# Patient Record
Sex: Female | Born: 1975 | Race: Asian | Hispanic: No | Marital: Married | Smoking: Never smoker
Health system: Southern US, Community
[De-identification: ages and names within clinical notes are randomized; demographics above are authoritative.]

## PROBLEM LIST (undated history)

## (undated) DIAGNOSIS — Z87442 Personal history of urinary calculi: Secondary | ICD-10-CM

## (undated) DIAGNOSIS — Z789 Other specified health status: Secondary | ICD-10-CM

## (undated) HISTORY — PX: NO PAST SURGERIES: SHX2092

## (undated) HISTORY — DX: Personal history of urinary calculi: Z87.442

---

## 1998-03-28 ENCOUNTER — Encounter: Admission: RE | Admit: 1998-03-28 | Discharge: 1998-03-28 | Payer: Self-pay | Admitting: Family Medicine

## 1998-06-20 ENCOUNTER — Encounter: Admission: RE | Admit: 1998-06-20 | Discharge: 1998-06-20 | Payer: Self-pay | Admitting: Family Medicine

## 1998-07-29 ENCOUNTER — Encounter: Admission: RE | Admit: 1998-07-29 | Discharge: 1998-07-29 | Payer: Self-pay | Admitting: Sports Medicine

## 1998-07-29 ENCOUNTER — Other Ambulatory Visit: Admission: RE | Admit: 1998-07-29 | Discharge: 1998-07-29 | Payer: Self-pay | Admitting: *Deleted

## 1998-08-28 ENCOUNTER — Encounter: Admission: RE | Admit: 1998-08-28 | Discharge: 1998-08-28 | Payer: Self-pay | Admitting: Family Medicine

## 1998-09-19 ENCOUNTER — Encounter: Admission: RE | Admit: 1998-09-19 | Discharge: 1998-09-19 | Payer: Self-pay | Admitting: Family Medicine

## 1998-12-19 ENCOUNTER — Encounter: Admission: RE | Admit: 1998-12-19 | Discharge: 1998-12-19 | Payer: Self-pay | Admitting: Family Medicine

## 1999-02-24 ENCOUNTER — Encounter: Admission: RE | Admit: 1999-02-24 | Discharge: 1999-02-24 | Payer: Self-pay | Admitting: Sports Medicine

## 1999-03-20 ENCOUNTER — Encounter: Admission: RE | Admit: 1999-03-20 | Discharge: 1999-03-20 | Payer: Self-pay | Admitting: Sports Medicine

## 1999-07-24 ENCOUNTER — Encounter: Admission: RE | Admit: 1999-07-24 | Discharge: 1999-07-24 | Payer: Self-pay | Admitting: Family Medicine

## 1999-09-18 ENCOUNTER — Encounter: Admission: RE | Admit: 1999-09-18 | Discharge: 1999-09-18 | Payer: Self-pay | Admitting: Family Medicine

## 1999-12-17 ENCOUNTER — Encounter: Admission: RE | Admit: 1999-12-17 | Discharge: 1999-12-17 | Payer: Self-pay | Admitting: Family Medicine

## 2000-01-22 ENCOUNTER — Encounter: Admission: RE | Admit: 2000-01-22 | Discharge: 2000-01-22 | Payer: Self-pay | Admitting: Family Medicine

## 2000-07-28 ENCOUNTER — Encounter: Admission: RE | Admit: 2000-07-28 | Discharge: 2000-07-28 | Payer: Self-pay | Admitting: Family Medicine

## 2001-02-03 ENCOUNTER — Encounter: Admission: RE | Admit: 2001-02-03 | Discharge: 2001-02-03 | Payer: Self-pay | Admitting: Family Medicine

## 2001-02-22 ENCOUNTER — Encounter: Admission: RE | Admit: 2001-02-22 | Discharge: 2001-02-22 | Payer: Self-pay | Admitting: Family Medicine

## 2001-03-27 ENCOUNTER — Encounter: Payer: Self-pay | Admitting: Obstetrics

## 2001-03-27 ENCOUNTER — Inpatient Hospital Stay (HOSPITAL_COMMUNITY): Admission: RE | Admit: 2001-03-27 | Discharge: 2001-03-27 | Payer: Self-pay | Admitting: Obstetrics

## 2001-03-27 ENCOUNTER — Encounter: Admission: RE | Admit: 2001-03-27 | Discharge: 2001-03-27 | Payer: Self-pay | Admitting: Family Medicine

## 2001-03-29 ENCOUNTER — Ambulatory Visit (HOSPITAL_COMMUNITY): Admission: RE | Admit: 2001-03-29 | Discharge: 2001-03-29 | Payer: Self-pay | Admitting: Obstetrics

## 2001-03-29 ENCOUNTER — Encounter (INDEPENDENT_AMBULATORY_CARE_PROVIDER_SITE_OTHER): Payer: Self-pay

## 2001-04-25 ENCOUNTER — Encounter: Admission: RE | Admit: 2001-04-25 | Discharge: 2001-04-25 | Payer: Self-pay | Admitting: Obstetrics & Gynecology

## 2001-10-18 ENCOUNTER — Encounter: Admission: RE | Admit: 2001-10-18 | Discharge: 2001-10-18 | Payer: Self-pay | Admitting: Family Medicine

## 2001-12-08 ENCOUNTER — Encounter: Admission: RE | Admit: 2001-12-08 | Discharge: 2001-12-08 | Payer: Self-pay | Admitting: Family Medicine

## 2002-01-21 ENCOUNTER — Emergency Department (HOSPITAL_COMMUNITY): Admission: EM | Admit: 2002-01-21 | Discharge: 2002-01-21 | Payer: Self-pay | Admitting: Emergency Medicine

## 2002-01-31 ENCOUNTER — Encounter: Admission: RE | Admit: 2002-01-31 | Discharge: 2002-01-31 | Payer: Self-pay | Admitting: Family Medicine

## 2002-02-26 ENCOUNTER — Encounter: Admission: RE | Admit: 2002-02-26 | Discharge: 2002-02-26 | Payer: Self-pay | Admitting: Family Medicine

## 2002-03-02 ENCOUNTER — Ambulatory Visit (HOSPITAL_COMMUNITY): Admission: RE | Admit: 2002-03-02 | Discharge: 2002-03-02 | Payer: Self-pay | Admitting: Family Medicine

## 2002-03-30 ENCOUNTER — Encounter: Admission: RE | Admit: 2002-03-30 | Discharge: 2002-03-30 | Payer: Self-pay | Admitting: Family Medicine

## 2002-05-03 ENCOUNTER — Encounter: Admission: RE | Admit: 2002-05-03 | Discharge: 2002-05-03 | Payer: Self-pay | Admitting: Family Medicine

## 2002-05-31 ENCOUNTER — Encounter: Admission: RE | Admit: 2002-05-31 | Discharge: 2002-05-31 | Payer: Self-pay | Admitting: Family Medicine

## 2002-06-14 ENCOUNTER — Encounter: Admission: RE | Admit: 2002-06-14 | Discharge: 2002-06-14 | Payer: Self-pay | Admitting: Family Medicine

## 2002-06-29 ENCOUNTER — Encounter: Admission: RE | Admit: 2002-06-29 | Discharge: 2002-06-29 | Payer: Self-pay | Admitting: Family Medicine

## 2002-07-10 ENCOUNTER — Encounter: Admission: RE | Admit: 2002-07-10 | Discharge: 2002-07-10 | Payer: Self-pay | Admitting: Sports Medicine

## 2002-07-18 ENCOUNTER — Encounter: Admission: RE | Admit: 2002-07-18 | Discharge: 2002-07-18 | Payer: Self-pay | Admitting: Family Medicine

## 2002-07-27 ENCOUNTER — Encounter: Admission: RE | Admit: 2002-07-27 | Discharge: 2002-07-27 | Payer: Self-pay | Admitting: Family Medicine

## 2002-08-03 ENCOUNTER — Encounter: Admission: RE | Admit: 2002-08-03 | Discharge: 2002-08-03 | Payer: Self-pay | Admitting: Family Medicine

## 2002-08-07 ENCOUNTER — Encounter: Payer: Self-pay | Admitting: *Deleted

## 2002-08-07 ENCOUNTER — Inpatient Hospital Stay (HOSPITAL_COMMUNITY): Admission: RE | Admit: 2002-08-07 | Discharge: 2002-08-07 | Payer: Self-pay | Admitting: *Deleted

## 2002-08-09 ENCOUNTER — Inpatient Hospital Stay (HOSPITAL_COMMUNITY): Admission: AD | Admit: 2002-08-09 | Discharge: 2002-08-11 | Payer: Self-pay | Admitting: Obstetrics and Gynecology

## 2002-08-16 ENCOUNTER — Encounter: Admission: RE | Admit: 2002-08-16 | Discharge: 2002-08-16 | Payer: Self-pay | Admitting: Family Medicine

## 2002-09-19 ENCOUNTER — Encounter: Admission: RE | Admit: 2002-09-19 | Discharge: 2002-09-19 | Payer: Self-pay | Admitting: Family Medicine

## 2002-11-09 ENCOUNTER — Encounter: Admission: RE | Admit: 2002-11-09 | Discharge: 2002-11-09 | Payer: Self-pay | Admitting: Family Medicine

## 2003-02-08 ENCOUNTER — Encounter: Admission: RE | Admit: 2003-02-08 | Discharge: 2003-02-08 | Payer: Self-pay | Admitting: Family Medicine

## 2003-05-10 ENCOUNTER — Encounter: Admission: RE | Admit: 2003-05-10 | Discharge: 2003-05-10 | Payer: Self-pay | Admitting: Family Medicine

## 2003-08-09 ENCOUNTER — Encounter: Admission: RE | Admit: 2003-08-09 | Discharge: 2003-08-09 | Payer: Self-pay | Admitting: Family Medicine

## 2003-09-19 ENCOUNTER — Encounter: Admission: RE | Admit: 2003-09-19 | Discharge: 2003-09-19 | Payer: Self-pay | Admitting: Family Medicine

## 2003-11-08 ENCOUNTER — Encounter: Admission: RE | Admit: 2003-11-08 | Discharge: 2003-11-08 | Payer: Self-pay | Admitting: Family Medicine

## 2004-02-07 ENCOUNTER — Encounter: Admission: RE | Admit: 2004-02-07 | Discharge: 2004-02-07 | Payer: Self-pay | Admitting: Family Medicine

## 2004-05-08 ENCOUNTER — Encounter: Admission: RE | Admit: 2004-05-08 | Discharge: 2004-05-08 | Payer: Self-pay | Admitting: Family Medicine

## 2004-08-07 ENCOUNTER — Ambulatory Visit: Payer: Self-pay | Admitting: Family Medicine

## 2004-12-14 ENCOUNTER — Ambulatory Visit: Payer: Self-pay | Admitting: Family Medicine

## 2005-01-12 ENCOUNTER — Ambulatory Visit: Payer: Self-pay | Admitting: Family Medicine

## 2005-02-09 ENCOUNTER — Ambulatory Visit: Payer: Self-pay | Admitting: Family Medicine

## 2005-02-19 ENCOUNTER — Ambulatory Visit: Payer: Self-pay | Admitting: Family Medicine

## 2005-03-04 ENCOUNTER — Ambulatory Visit: Payer: Self-pay | Admitting: Family Medicine

## 2005-03-12 ENCOUNTER — Ambulatory Visit: Payer: Self-pay | Admitting: Sports Medicine

## 2005-09-10 ENCOUNTER — Ambulatory Visit: Payer: Self-pay | Admitting: Family Medicine

## 2006-01-21 ENCOUNTER — Ambulatory Visit: Payer: Self-pay | Admitting: Sports Medicine

## 2006-01-26 ENCOUNTER — Inpatient Hospital Stay (HOSPITAL_COMMUNITY): Admission: AD | Admit: 2006-01-26 | Discharge: 2006-01-26 | Payer: Self-pay | Admitting: Obstetrics and Gynecology

## 2006-02-03 ENCOUNTER — Encounter (INDEPENDENT_AMBULATORY_CARE_PROVIDER_SITE_OTHER): Payer: Self-pay | Admitting: *Deleted

## 2006-02-11 ENCOUNTER — Ambulatory Visit: Payer: Self-pay | Admitting: Family Medicine

## 2006-03-15 ENCOUNTER — Ambulatory Visit: Payer: Self-pay | Admitting: Family Medicine

## 2006-04-12 ENCOUNTER — Ambulatory Visit (HOSPITAL_COMMUNITY): Admission: RE | Admit: 2006-04-12 | Discharge: 2006-04-12 | Payer: Self-pay | Admitting: Family Medicine

## 2006-04-14 ENCOUNTER — Ambulatory Visit: Payer: Self-pay | Admitting: Family Medicine

## 2006-05-07 ENCOUNTER — Emergency Department (HOSPITAL_COMMUNITY): Admission: EM | Admit: 2006-05-07 | Discharge: 2006-05-07 | Payer: Self-pay | Admitting: Emergency Medicine

## 2006-05-12 ENCOUNTER — Ambulatory Visit: Payer: Self-pay | Admitting: Family Medicine

## 2006-05-27 ENCOUNTER — Ambulatory Visit: Payer: Self-pay | Admitting: Family Medicine

## 2006-06-03 ENCOUNTER — Ambulatory Visit: Payer: Self-pay | Admitting: Family Medicine

## 2006-06-06 ENCOUNTER — Ambulatory Visit: Payer: Self-pay | Admitting: Sports Medicine

## 2006-07-11 ENCOUNTER — Ambulatory Visit: Payer: Self-pay | Admitting: Family Medicine

## 2006-07-13 ENCOUNTER — Inpatient Hospital Stay (HOSPITAL_COMMUNITY): Admission: AD | Admit: 2006-07-13 | Discharge: 2006-07-13 | Payer: Self-pay | Admitting: Gynecology

## 2006-07-13 ENCOUNTER — Ambulatory Visit: Payer: Self-pay | Admitting: *Deleted

## 2006-07-18 ENCOUNTER — Ambulatory Visit (HOSPITAL_COMMUNITY): Admission: RE | Admit: 2006-07-18 | Discharge: 2006-07-18 | Payer: Self-pay | Admitting: Family Medicine

## 2006-07-18 ENCOUNTER — Ambulatory Visit: Payer: Self-pay | Admitting: Family Medicine

## 2006-07-26 ENCOUNTER — Ambulatory Visit: Payer: Self-pay | Admitting: Sports Medicine

## 2006-08-09 ENCOUNTER — Ambulatory Visit: Payer: Self-pay | Admitting: Sports Medicine

## 2006-08-18 ENCOUNTER — Ambulatory Visit: Payer: Self-pay | Admitting: Family Medicine

## 2006-08-25 ENCOUNTER — Ambulatory Visit: Payer: Self-pay | Admitting: Family Medicine

## 2006-08-29 ENCOUNTER — Ambulatory Visit: Payer: Self-pay | Admitting: Gynecology

## 2006-08-29 ENCOUNTER — Inpatient Hospital Stay (HOSPITAL_COMMUNITY): Admission: AD | Admit: 2006-08-29 | Discharge: 2006-09-01 | Payer: Self-pay | Admitting: Gynecology

## 2006-08-30 ENCOUNTER — Ambulatory Visit: Payer: Self-pay | Admitting: Sports Medicine

## 2006-09-02 ENCOUNTER — Ambulatory Visit: Payer: Self-pay | Admitting: Family Medicine

## 2006-09-05 ENCOUNTER — Ambulatory Visit: Payer: Self-pay | Admitting: Sports Medicine

## 2006-10-24 ENCOUNTER — Ambulatory Visit: Payer: Self-pay | Admitting: Family Medicine

## 2007-02-02 DIAGNOSIS — E781 Pure hyperglyceridemia: Secondary | ICD-10-CM | POA: Insufficient documentation

## 2007-02-03 ENCOUNTER — Encounter (INDEPENDENT_AMBULATORY_CARE_PROVIDER_SITE_OTHER): Payer: Self-pay | Admitting: *Deleted

## 2007-03-02 ENCOUNTER — Emergency Department (HOSPITAL_COMMUNITY): Admission: EM | Admit: 2007-03-02 | Discharge: 2007-03-02 | Payer: Self-pay | Admitting: Emergency Medicine

## 2007-05-09 ENCOUNTER — Telehealth: Payer: Self-pay | Admitting: *Deleted

## 2007-05-09 ENCOUNTER — Encounter: Payer: Self-pay | Admitting: *Deleted

## 2007-05-09 ENCOUNTER — Encounter: Payer: Self-pay | Admitting: Family Medicine

## 2007-05-09 ENCOUNTER — Ambulatory Visit: Payer: Self-pay | Admitting: Sports Medicine

## 2007-05-09 LAB — CONVERTED CEMR LAB
Bilirubin Urine: NEGATIVE
Protein, U semiquant: 30
Urobilinogen, UA: 0.2

## 2007-05-11 ENCOUNTER — Encounter: Admission: RE | Admit: 2007-05-11 | Discharge: 2007-05-11 | Payer: Self-pay | Admitting: Sports Medicine

## 2007-05-11 ENCOUNTER — Encounter (INDEPENDENT_AMBULATORY_CARE_PROVIDER_SITE_OTHER): Payer: Self-pay | Admitting: *Deleted

## 2007-05-11 ENCOUNTER — Telehealth: Payer: Self-pay | Admitting: *Deleted

## 2007-05-11 ENCOUNTER — Ambulatory Visit: Payer: Self-pay | Admitting: Family Medicine

## 2007-05-11 LAB — CONVERTED CEMR LAB: hCG, Beta Chain, Quant, S: <2 milliintl units/mL

## 2007-05-17 ENCOUNTER — Telehealth: Payer: Self-pay | Admitting: *Deleted

## 2007-10-11 ENCOUNTER — Ambulatory Visit: Payer: Self-pay | Admitting: Family Medicine

## 2007-11-24 ENCOUNTER — Encounter (INDEPENDENT_AMBULATORY_CARE_PROVIDER_SITE_OTHER): Payer: Self-pay | Admitting: Family Medicine

## 2007-11-24 ENCOUNTER — Ambulatory Visit: Payer: Self-pay | Admitting: Family Medicine

## 2007-11-24 DIAGNOSIS — Z87442 Personal history of urinary calculi: Secondary | ICD-10-CM | POA: Insufficient documentation

## 2007-11-24 DIAGNOSIS — O99810 Abnormal glucose complicating pregnancy: Secondary | ICD-10-CM | POA: Insufficient documentation

## 2007-11-24 HISTORY — DX: Personal history of urinary calculi: Z87.442

## 2007-12-01 ENCOUNTER — Encounter (INDEPENDENT_AMBULATORY_CARE_PROVIDER_SITE_OTHER): Payer: Self-pay | Admitting: Family Medicine

## 2008-09-06 ENCOUNTER — Ambulatory Visit: Payer: Self-pay | Admitting: Family Medicine

## 2010-02-02 ENCOUNTER — Ambulatory Visit: Payer: Self-pay | Admitting: Family Medicine

## 2010-02-02 ENCOUNTER — Encounter: Payer: Self-pay | Admitting: Family Medicine

## 2010-02-02 LAB — CONVERTED CEMR LAB: Pap Smear: NEGATIVE

## 2010-02-05 LAB — CONVERTED CEMR LAB
Cholesterol: 213 mg/dL — ABNORMAL HIGH (ref 0–200)
LDL Cholesterol: 139 mg/dL — ABNORMAL HIGH (ref 0–99)
VLDL: 34 mg/dL (ref 0–40)

## 2010-04-07 ENCOUNTER — Ambulatory Visit: Payer: Self-pay | Admitting: Family Medicine

## 2010-04-07 DIAGNOSIS — L255 Unspecified contact dermatitis due to plants, except food: Secondary | ICD-10-CM | POA: Insufficient documentation

## 2010-12-08 ENCOUNTER — Ambulatory Visit: Admit: 2010-12-08 | Payer: Self-pay

## 2011-01-05 NOTE — Assessment & Plan Note (Signed)
Summary: poison ivy   Vital Signs:  Patient profile:   35 year old female Height:      62 inches Weight:      154.2 pounds BMI:     28.31 Temp:     98.5 degrees F oral Pulse rate:   70 / minute BP sitting:   111 / 80  (left arm) Cuff size:   regular  Vitals Entered By: Gladstone Pih (Apr 07, 2010 11:01 AM) CC: poison ivy Is Patient Diabetic? No Pain Assessment Patient in pain? no        Primary Care Provider:  Eustaquio Boyden  MD  CC:  poison ivy.  History of Present Illness: 35yo F w/ acute poison ivy  Poison Ivy: x 6 days.  States she was exposed to poison ivy last week while working in the yard.  She has a rash on her hands, arms, and face sparing the eyes, mouth, or mucosa.  No difficulty breathing.  Habits & Providers  Alcohol-Tobacco-Diet     Tobacco Status: never  Current Medications (verified): 1)  Prednisone 10 Mg Tabs (Prednisone) .... 4 Tabs By Mouth Daily X 3 Days, Then 3 Tabs X 3 Days, Then 2 Tabs X 3 Days, Then 1 Tab X 3 Days 2)  Hydroxyzine Hcl 25 Mg Tabs (Hydroxyzine Hcl) .Marland Kitchen.. 1 Tab By Mouth Q6h As Needed For Itching  Allergies (verified): No Known Drug Allergies  Review of Systems      See HPI  Physical Exam  General:  VS Reviewed. Well appearing, NAD.  Lungs:  no respiratory distress Skin:  erythematous papular vesicular lesions that are linear on her hands, arms, and face   Impression & Recommendations:  Problem # 1:  POISON IVY DERMATITIS (ICD-692.6) Assessment New  Hx and exam c/w acute poison ivy dermatitis sparing the mucosa plan to treat with longer prednisone taper with hydroxyzine for itching will f/u next week if no improvement  Her updated medication list for this problem includes:    Prednisone 10 Mg Tabs (Prednisone) .Marland KitchenMarland KitchenMarland KitchenMarland Kitchen 4 tabs by mouth daily x 3 days, then 3 tabs x 3 days, then 2 tabs x 3 days, then 1 tab x 3 days  Orders: [facility]- Est Level  3 (25956)  Complete Medication List: 1)  Prednisone 10 Mg Tabs (Prednisone)  .... 4 tabs by mouth daily x 3 days, then 3 tabs x 3 days, then 2 tabs x 3 days, then 1 tab x 3 days 2)  Hydroxyzine Hcl 25 Mg Tabs (Hydroxyzine hcl) .Marland Kitchen.. 1 tab by mouth q6h as needed for itching  Patient Instructions: 1)  Follow up next week if symptoms not improving. 2)  Take the prednisone and hydroxyzine as prescribed. Prescriptions: HYDROXYZINE HCL 25 MG TABS (HYDROXYZINE HCL) 1 tab by mouth q6h as needed for itching  #20 x 0   Entered and Authorized by:   Marisue Ivan  MD   Signed by:   Marisue Ivan  MD on 04/07/2010   Method used:   Electronically to        [facility] (retail)       [address]       [place], [place]  [id number]       Ph: [phone]       Fax: [phone]   RxID:   [id number] PREDNISONE 10 MG TABS (PREDNISONE) 4 tabs by mouth daily x 3 days, then 3 tabs x 3 days, then 2 tabs x 3 days, then 1  tab x 3 days  #30 x 0   Entered and Authorized by:   Marisue Ivan  MD   Signed by:   Marisue Ivan  MD on 04/07/2010   Method used:   Electronically to        [facility] (retail)       [address]       [place], [place]  [id number]       Ph: [phone]       Fax: [phone]   RxID:   [id number]

## 2011-01-05 NOTE — Assessment & Plan Note (Signed)
Summary: CPE- pap/A1C/lipids   History of Present Illness:  Pt states that she has no complaints today and simply would like to have a complete physical ex    Habits & Providers  Alcohol-Tobacco-Diet     Alcohol drinks/day: 0     Tobacco Status: never     Diet Comments: 4-5 servings of vegtables per day in rice  Exercise-Depression-Behavior     Does Patient Exercise: yes     Type of exercise: walking      Exercise (avg: min/session): 10-20     Times/week: 4     Have you felt down or hopeless? no     Have you felt little pleasure in things? no     Drug Use: never     Seat Belt Use: always  Current Medications (verified): 1)  None  Allergies (verified): No Known Drug Allergies  Social History: Moved to [place] at age 59.  2 daughters (56, 80 year old), 31 son (93 year old).  Works as a Firefighter for [employer]; -no smoking hx; no EtOH; lives with husband Engineer, petroleum) and children      Does Patient Exercise:  yes Drug Use:  never Risk analyst Use:  always  Review of Systems  The patient denies anorexia, fever, weight gain, vision loss, decreased hearing, chest pain, syncope, and peripheral edema.    Physical Exam  General:  Well-developed,well-nourished,in no acute distress; alert,appropriate and cooperative throughout examination Ears:  External ear exam shows no significant lesions or deformities.  Otoscopic examination reveals clear canals, tympanic membranes are intact bilaterally without bulging, retraction, inflammation or discharge. Hearing is grossly normal bilaterally. Breasts:  No mass, nodules, thickening, tenderness, bulging, retraction, inflamation, nipple discharge or skin changes noted.   Lungs:  Normal respiratory effort, chest expands symmetrically. Lungs are clear to auscultation, no crackles or wheezes. Heart:  Normal rate and regular rhythm. S1 and S2 normal without gallop, murmur, click, rub or other extra sounds. Genitalia:  Pelvic Exam:        External:  normal female genitalia without lesions or masses        Vagina: normal without lesions or masses        Cervix: normal without lesions or masses        Adnexa: normal bimanual exam without masses or fullness        Uterus: normal by palpation        Pap smear: performed- some bleeding with attainment of samples.  Unable to visualize IUD string-  vaginal walls partially occluding view.   Psych:  Oriented X3.  PHQ-2 negative   Impression & Recommendations:  Problem # 1:  SCREENING FOR MALIGNANT NEOPLASM OF THE CERVIX (ICD-V76.2) Assessment Unchanged Pap smear sent to lab.  Pt reports that she has no history of abnormal pap.  Will contact pt with any abnormal results.  Orders: Pap Smear-[facility] ([id number]) [facility] - Est  18-39 yrs ([id number])  Problem # 2:  IUD (ICD-V25.1) Assessment: Unchanged Unable to visualize IUD string.  Pt states that this has happened in the past and that they sent her for ultrasound and that the IUD was still in place.  Pt states that she last felt the string about 2 weeks ago.  She also states that she would like to try to feel it at home before going again to ultrasound.  Encouraged pt to please call if she is not able to feel the strings so that we can send her for repeat ultrasound to confirm  placement.  Otherwise we can not ensure that the IUD is in the correct position to prevent pregancy.  Pt states understanding. Orders: [facility] - Est  18-39 yrs ([id number])  Problem # 3:  Hx of GESTATIONAL DIABETES (ICD-648.80) A1C 5.6--will continue to screen yearly since pt high risk for type 2 diabetes 2/2 to hx of gestational diabetes. Orders: A1C-[facility] ([id number]) [facility] - Est  18-39 yrs ([id number])  Problem # 4:  HYPERTRIGLYCERIDEMIA (ICD-272.1) lipid panel drawn today to ensure that lipids WNL.  Pt has risk factor of father with MI in 69's.  Will contact pt with any abnormal result.  Orders: Lipid-[facility] ([id number]) [facility] - Est  18-39 yrs [phone])  Patient Instructions: 1)  return for 1  year check up in one year 2)  I will call you if any abnormality in lab work. 3)  If you are unable to feel the IUD string, please give me a call and we will schedule and ultrasound to confirm placement of IUD.   Prevention & Chronic Care Immunizations   Influenza vaccine: Fluvax Non-MCR  (09/10/2008)    Tetanus booster: 10/06/2001: Done.    Pneumococcal vaccine: Not documented  Other Screening   Pap smear: Done.  (02/03/2006)   Smoking status: never  (02/02/2010)  Lipids   Total Cholesterol: Not documented   LDL: Not documented   LDL Direct: Not documented   HDL: Not documented   Triglycerides: Not documented    SGOT (AST): Not documented   SGPT (ALT): Not documented   Alkaline phosphatase: Not documented   Total bilirubin: Not documented    Lipid flowsheet reviewed?: Yes   Progress toward LDL goal: Unchanged  Self-Management Support :    Lipid self-management support: Not documented   Laboratory Results   Blood Tests   Date/Time Received: February 02, 2010 4:53 PM  Date/Time Reported: February 02, 2010 5:04 PM   HGBA1C: 5.6%   (Normal Range: Non-Diabetic - 3-6%   Control Diabetic - 6-8%)  Comments: ...........test performed by...........Marland KitchenTerese Door, CMA

## 2011-01-27 ENCOUNTER — Encounter: Payer: Self-pay | Admitting: *Deleted

## 2011-03-26 ENCOUNTER — Encounter: Payer: Self-pay | Admitting: Family Medicine

## 2011-03-26 ENCOUNTER — Ambulatory Visit (INDEPENDENT_AMBULATORY_CARE_PROVIDER_SITE_OTHER): Payer: BLUE CROSS/BLUE SHIELD | Admitting: Family Medicine

## 2011-03-26 VITALS — BP 112/77 | Temp 98.3°F | Wt 155.0 lb

## 2011-03-26 DIAGNOSIS — L255 Unspecified contact dermatitis due to plants, except food: Secondary | ICD-10-CM

## 2011-03-26 MED ORDER — CLOBETASOL PROPIONATE 0.05 % EX GEL: CUTANEOUS | Status: DC

## 2011-03-26 MED ORDER — PREDNISONE 10 MG PO TABS: ORAL_TABLET | ORAL | Status: DC

## 2011-03-26 NOTE — Assessment & Plan Note (Signed)
Rash consistent with contact dermatitis, likely from poison ivy.  Will give steroid taper and rx temovate cream for short term.  Told to return if worsening symptoms or redness/swelling develops.

## 2011-03-26 NOTE — Progress Notes (Signed)
  Subjective:    Patient ID: Laura Velez, female    DOB: 05/02/1976, 35 y.o.   MRN: [id number]  HPI Out picking leaves and exposed to poison ivy two weeks ago.  Last had last year and used a prescription cream and oral steroid.  Rash mainly on arm, neck and chin and very itchy.  Using ivarest at home, helps some.  Some clear drainage from area on arm.     Review of Systems Denies fever, chills, bleeding of areas.    Objective:   Physical Exam  Skin: Rash noted.       vesiculo-Papular rash on arms and neck with streaking.  Non pustular, no drainage, bleeding, warmth, surrounding erythema.          Assessment & Plan:

## 2011-04-23 NOTE — Op Note (Signed)
[facility]  Patient:    Laura Velez, Laura Velez                            MRN: [id number] Proc. Date: 03/29/01 Adm. Date:  [id number] Attending:  Tammi Sou CC:         [facility]   Operative Report  PREOPERATIVE DIAGNOSIS:       A 10 week anembryonic gestation on ultrasound.  POSTOPERATIVE DIAGNOSIS:      A 10 week anembryonic gestation on ultrasound.  PROCEDURE:                    Suction dilation and evacuation.  SURGEON:                      Charles A. Clearance Coots, M.D.  ANESTHESIA:                   MAC with paracervical block.  ESTIMATED BLOOD LOSS:         100 ml.  COMPLICATIONS:                None.  SPECIMENS:                    Products of conception.  DESCRIPTION OF PROCEDURE:     The patient was brought to the operating room and, after satisfactory IV sedation, the legs were brought up in stirrups and the vagina was prepped and draped in the usual sterile fashion.  The urinary bladder was emptied of approximately 50 cc of clear urine.  Bimanual examination revealed the uterus to about 10-12 size and mid position.  A sterile speculum was inserted in the vaginal vault and the cervix was isolated.  The anterior lip of the cervix was grasped with a single-tooth tenaculum.  A paracervical block of 2% Xylocaine was injected in the lateral fornices at the 3 and 9 oclock positions in routine fashion, approximately 8 ml in each lateral fornix.  The uterus was sounded and the cervix was dilated to a #25 Pratt dilator.  A #8 suction catheter was then easily introduced into the uterine cavity and all contents were evacuated.  There was no active bleeding at the conclusion of the procedure.  The uterus contracted down and was firm after the procedure.  All instruments were retired.  The patient tolerated the procedure well and was transported to the recovery room in satisfactory condition. DD:  03/29/01 TD:   03/30/01 Job: [id number]

## 2011-07-09 ENCOUNTER — Encounter: Payer: Self-pay | Admitting: Family Medicine

## 2011-07-09 ENCOUNTER — Ambulatory Visit (INDEPENDENT_AMBULATORY_CARE_PROVIDER_SITE_OTHER): Payer: BLUE CROSS/BLUE SHIELD | Admitting: Family Medicine

## 2011-07-09 VITALS — BP 123/85 | HR 87 | Temp 98.3°F | Wt 150.3 lb

## 2011-07-09 DIAGNOSIS — J029 Acute pharyngitis, unspecified: Secondary | ICD-10-CM | POA: Insufficient documentation

## 2011-07-09 LAB — POCT RAPID STREP A (OFFICE): Rapid Strep A Screen: NEGATIVE

## 2011-07-09 MED ORDER — PENICILLIN V POTASSIUM 500 MG PO TABS: 500.0000 mg | ORAL_TABLET | Freq: Three times a day (TID) | ORAL | Status: AC

## 2011-07-09 NOTE — Progress Notes (Signed)
  Subjective:    Patient ID: Laura Velez, female    DOB: 07/13/1976, 35 y.o.   MRN: [id number]  HPI Patient here with complaint of generalized body ache/malaise, and sore throat, which both began on Weds, Aug 1st.  Son (age 37) was diagnosed with strep throat in his pediatrician's office on that date, started on abx for this.  She noticed onset of sore throat and malaise on that date, has been same ever since.  Has had no fevers or chills, no cough, no ear pain, no rhinorrhea, no nausea or vomiting.  She has had some "hot urine" when she voids, but not increased frequency or urgency, no change in appearance of urine.   Has had UTIs in the past, this does not feel like one.    Review of Systems  See HPI. Son is only sick contact.       Objective:   Physical Exam Well appearing, no apparent distress.  HEENT Neck supple.  Shotty anterior cervical adenopathy. Enlarged erythematous tonsils and small amount pharyngeal exudates; tonsils are symmetrical and not touching ("kissing").  TMs clear.  Conjunctivae clear bilaterally. No nasal discharge or purulence, no maxillary tenderness.  COR RRR, no extra sounds PULM Clear bilaterally, no rales or wheezes.  ABD Soft, nontender, no organomegaly, no suprapubic or CVA tenderness.        Assessment & Plan:

## 2011-07-09 NOTE — Patient Instructions (Signed)
It was a pleasure to see you today.  Your findings on today's exam and testing are somewhat conflicted regarding the possibility of strep throat.  Your throat exam and absence of cough support it; the negative strep test and absence of fevers go against it.   I am treating you with Penicillin V 500mg  tablets, take 1 tablet by mouth every 8 hours (three times daily) for 10 days.   You may use a throat spray (Chlora-Septic) to soothe your throat (available over the counter). Gargles with salt water.  Ibuprofen over the counter (200mg  tablets), you may take 3 to 4 tablets by mouth every 6 to 8 hours as needed for body aches. Best to take with something to eat.  If you continue to feel achy and ill through the weekend, I want you to call us on Monday morning to see you back.   Please take note if you have more burning/discomfort with urinating, or if you are going to the bathroom more frequently.

## 2011-07-09 NOTE — Assessment & Plan Note (Signed)
Patient with acute pharyngitis, generalized ache, 35 yr old son with diagnosed strep throat, and she has absence of cough.  Centor criteria that are lacking include fevers, marked adenopathy.  Her rapid strep is negative.  Other possible sources for generalized malaise and sore throat are few; she initially reported some mild dysuria ("feels hot") without change in urine appearance or habits, does not feel like prior cystitis that she had years ago.  Is unable to give urine sample today (just went to [place]).  Will opt to treat empirically for GAS pharyngitis, with instructions to call back if not better by Aug 6.  If new sxs develop, to take note of them.  She agrees with plan.

## 2011-10-15 ENCOUNTER — Ambulatory Visit (INDEPENDENT_AMBULATORY_CARE_PROVIDER_SITE_OTHER): Payer: BLUE CROSS/BLUE SHIELD | Admitting: *Deleted

## 2011-10-15 DIAGNOSIS — Z23 Encounter for immunization: Secondary | ICD-10-CM

## 2011-11-19 ENCOUNTER — Ambulatory Visit (INDEPENDENT_AMBULATORY_CARE_PROVIDER_SITE_OTHER): Payer: BLUE CROSS/BLUE SHIELD | Admitting: Family Medicine

## 2011-11-19 ENCOUNTER — Encounter: Payer: Self-pay | Admitting: Family Medicine

## 2011-11-19 VITALS — BP 112/79 | HR 61 | Temp 98.1°F | Ht 62.0 in | Wt 152.0 lb

## 2011-11-19 DIAGNOSIS — T8389XA Other specified complication of genitourinary prosthetic devices, implants and grafts, initial encounter: Secondary | ICD-10-CM

## 2011-11-19 DIAGNOSIS — T8332XA Displacement of intrauterine contraceptive device, initial encounter: Secondary | ICD-10-CM

## 2011-11-19 NOTE — Progress Notes (Signed)
  Subjective:    Patient ID: Laura Velez, female    DOB: 1976-03-20, 35 y.o.   MRN: [id number]  HPI Patient here for IUD removal and reinsertion: Patient states that she would like IUD removed. In past patient has had difficulty feeling strings and providers have had difficulty identifying strings. Has had ultrasound to confirm placement in the past. Hasn't like the IUD-would like reinsertion. Has had this IUD x5 years. Have spotting times a few days each month during menstrual period.  Review of Systems As per above.no abdominal pain. No fever. No weight changes. No vaginal bleeding currently. No vaginal discharge.    Objective:   Physical Exam  Constitutional: She is oriented to person, place, and time. She appears well-developed and well-nourished.  Cardiovascular: Normal rate.   Pulmonary/Chest: Effort normal. No respiratory distress.  Abdominal: Soft. She exhibits no distension. There is no tenderness.  Genitourinary: Vagina normal and uterus normal. Rectal exam shows external hemorrhoid (at 12 clock position). There is no rash, tenderness, lesion or injury on the right labia. There is no rash, tenderness, lesion or injury on the left labia. Cervix exhibits no motion tenderness and no friability. Discharge: moderate amount yellow/clear discharge.    Musculoskeletal: She exhibits no edema.  Neurological: She is alert and oriented to person, place, and time.  Psychiatric: She has a normal mood and affect. Her behavior is normal.          Assessment & Plan:

## 2011-11-19 NOTE — Assessment & Plan Note (Signed)
Unable to visualize IUD strings. Will obtain ultrasound to confirm placement. Will refer to OB/GYN for removal of IUD and reinsertion.

## 2011-12-22 ENCOUNTER — Ambulatory Visit: Payer: BLUE CROSS/BLUE SHIELD | Admitting: Family Medicine

## 2011-12-31 ENCOUNTER — Ambulatory Visit (INDEPENDENT_AMBULATORY_CARE_PROVIDER_SITE_OTHER): Payer: BLUE CROSS/BLUE SHIELD | Admitting: Family Medicine

## 2011-12-31 VITALS — BP 110/72 | HR 72 | Temp 98.0°F | Ht 62.5 in | Wt 152.3 lb

## 2011-12-31 DIAGNOSIS — J069 Acute upper respiratory infection, unspecified: Secondary | ICD-10-CM

## 2011-12-31 DIAGNOSIS — M25519 Pain in unspecified shoulder: Secondary | ICD-10-CM

## 2011-12-31 DIAGNOSIS — T148XXA Other injury of unspecified body region, initial encounter: Secondary | ICD-10-CM

## 2011-12-31 MED ORDER — KETOROLAC TROMETHAMINE 30 MG/ML IJ SOLN
30.0000 mg | Freq: Once | INTRAMUSCULAR | Status: AC
  Administered 2011-12-31: 30 mg via INTRAMUSCULAR

## 2011-12-31 MED ORDER — BENZONATATE 100 MG PO CAPS: 100.0000 mg | ORAL_CAPSULE | Freq: Three times a day (TID) | ORAL | Status: DC | PRN

## 2011-12-31 NOTE — Assessment & Plan Note (Signed)
Toradol 30 IM x 1 in clinic. Dicussed prn NSAID use. Will continue to follow prn.

## 2011-12-31 NOTE — Patient Instructions (Signed)
Viral Infections A virus is a type of germ. Viruses can cause:  Minor sore throats.   Aches and pains.   Headaches.   Runny nose.   Rashes.   Watery eyes.   Tiredness.   Coughs.   Loss of appetite.   Feeling sick to your stomach (nausea).   Throwing up (vomiting).   Watery poop (diarrhea).  HOME CARE   Only take medicines as told by your doctor.   Drink enough water and fluids to keep your pee (urine) clear or pale yellow. Sports drinks are a good choice.   Get plenty of rest and eat healthy. Soups and broths with crackers or rice are fine.  GET HELP RIGHT AWAY IF:   You have a very bad headache.   You have shortness of breath.   You have chest pain or neck pain.   You have an unusual rash.   You cannot stop throwing up.   You have watery poop that does not stop.   You cannot keep fluids down.   You or your child has a temperature by mouth above 102 F (38.9 C), not controlled by medicine.   Your baby is older than 3 months with a rectal temperature of 102 F (38.9 C) or higher.   Your baby is 3 months old or younger with a rectal temperature of 100.4 F (38 C) or higher.  MAKE SURE YOU:   Understand these instructions.   Will watch this condition.   Will get help right away if you are not doing well or get worse.  Document Released: 11/04/2008 Document Revised: 08/04/2011 Document Reviewed: 03/30/2011 Exit[employer] Patient Information 2012 [employer]. 

## 2011-12-31 NOTE — Assessment & Plan Note (Signed)
Discussed supportive care as well as infectious red flags. Tessalon Perles for cough. Handout given.

## 2011-12-31 NOTE — Progress Notes (Signed)
  Subjective:    Patient ID: Laura Velez, female    DOB: 1976/06/15, 36 y.o.   MRN: [id number]  HPI Pt presents today with chief complaint of URI. Symptoms include rhinorrhea, cough, generalized malaise, headache. No fevers or chills. Pt states that she has had multiple sick contacts with similar sxs. No nausea, vomiting, diarrhea. Pt states that she has had some pleuritic chest pain associated with coughing. Pt also reports that she had a mild L shoulder strain associated with a coughing episode yesterday. Pt is a non smoker. Pt states that she has had a flu shot this year.    Review of Systems See HPI, otherwise ROS negative. .     Objective:   Physical Exam Gen: up in chair, NAD HEENT: NCAT, EOMI, TMs clear bilaterally; +nasal erythema, rhinorrhea bilaterally, + post oropharyngeal erythema  CV: RRR, no murmurs auscultated PULM: CTAB, no wheezes, rales, rhoncii ABD: S/NT/+ bowel sounds  EXT: 2+ peripheral pulses   MSK: Mild TTP along medial aspect of L scapula Assessment & Plan:

## 2012-02-09 ENCOUNTER — Other Ambulatory Visit: Payer: Self-pay | Admitting: Obstetrics and Gynecology

## 2012-02-09 ENCOUNTER — Other Ambulatory Visit (HOSPITAL_COMMUNITY)
Admission: RE | Admit: 2012-02-09 | Discharge: 2012-02-09 | Disposition: A | Discharge status: Home / Self Care | Payer: BC Managed Care – PPO | Source: Ambulatory Visit | Attending: Obstetrics and Gynecology | Admitting: Obstetrics and Gynecology

## 2012-02-09 DIAGNOSIS — Z01419 Encounter for gynecological examination (general) (routine) without abnormal findings: Secondary | ICD-10-CM | POA: Insufficient documentation

## 2012-02-14 ENCOUNTER — Other Ambulatory Visit: Payer: Self-pay | Admitting: Obstetrics and Gynecology

## 2012-02-14 DIAGNOSIS — N63 Unspecified lump in unspecified breast: Secondary | ICD-10-CM

## 2012-02-21 ENCOUNTER — Ambulatory Visit
Admission: RE | Admit: 2012-02-21 | Discharge: 2012-02-21 | Disposition: A | Discharge status: Home / Self Care | Payer: BLUE CROSS/BLUE SHIELD | Source: Ambulatory Visit | Attending: Obstetrics and Gynecology | Admitting: Obstetrics and Gynecology

## 2012-02-21 DIAGNOSIS — N63 Unspecified lump in unspecified breast: Secondary | ICD-10-CM

## 2012-04-18 ENCOUNTER — Encounter: Payer: Self-pay | Admitting: Family Medicine

## 2012-04-18 ENCOUNTER — Ambulatory Visit (INDEPENDENT_AMBULATORY_CARE_PROVIDER_SITE_OTHER): Payer: BC Managed Care – PPO | Admitting: Family Medicine

## 2012-04-18 VITALS — BP 110/78 | HR 76 | Temp 98.4°F | Ht 62.5 in | Wt 156.3 lb

## 2012-04-18 DIAGNOSIS — J029 Acute pharyngitis, unspecified: Secondary | ICD-10-CM

## 2012-04-18 LAB — POCT RAPID STREP A (OFFICE): Rapid Strep A Screen: NEGATIVE

## 2012-04-18 NOTE — Patient Instructions (Signed)

## 2012-04-20 ENCOUNTER — Encounter: Payer: Self-pay | Admitting: Family Medicine

## 2012-04-20 NOTE — Progress Notes (Signed)
  Subjective:     Laura Velez is a 36 y.o. female who presents for evaluation of sore throat. Associated symptoms include myalgias, post nasal drip, sinus and nasal congestion and sore throat. There is no cough or fever, nausea or vomiting, diarrhea. Onset of symptoms was 2 days ago, and have been unchanged since that time. She is drinking plenty of fluids. She has not had a recent close exposure to someone with proven streptococcal pharyngitis, though her son has a history of tonsil problems lately.  The following portions of the patient's history were reviewed and updated as appropriate: allergies, current medications, past family history, past medical history, past social history and problem list.  Review of Systems Pertinent items are noted in HPI.    Objective:    BP 110/78  Pulse 76  Temp(Src) 98.4 F (36.9 C) (Oral)  Ht 5' 2.5" (1.588 m)  Wt 156 lb 4.8 oz (70.897 kg)  BMI 28.13 kg/m2 General:  alert, cooperative, appears stated age and no distress  Mouth:  lips, mucosa, and tongue normal; teeth and gums normal and pharyngeal erythema, small edema noted. no exudates or lesions noted.   Neck: no adenopathy and supple, symmetrical, trachea midline.  : Chest - clear to auscultation, no wheezes, rales or rhonchi, symmetric air entry Heart - normal rate, regular rhythm, normal S1, S2, no murmurs, rubs, clicks or gallops Neurological - alert, oriented, normal speech, no focal findings or movement disorder noted   Laboratory Strep test done. Results:negative    Assessment:     Acute Pharyngitis, likely  Viral pharyngitis    Plan:    Use of OTC analgesics recommended as well as salt water gargles. Use of decongestant recommended. Follow up as needed.

## 2012-06-12 ENCOUNTER — Ambulatory Visit (INDEPENDENT_AMBULATORY_CARE_PROVIDER_SITE_OTHER): Payer: BC Managed Care – PPO | Admitting: Physician Assistant

## 2012-06-12 VITALS — BP 114/78 | HR 61 | Temp 98.5°F | Resp 16 | Ht 62.0 in | Wt 156.2 lb

## 2012-06-12 DIAGNOSIS — H109 Unspecified conjunctivitis: Secondary | ICD-10-CM

## 2012-06-12 DIAGNOSIS — A499 Bacterial infection, unspecified: Secondary | ICD-10-CM

## 2012-06-12 DIAGNOSIS — B9689 Other specified bacterial agents as the cause of diseases classified elsewhere: Secondary | ICD-10-CM

## 2012-06-12 DIAGNOSIS — H1089 Other conjunctivitis: Secondary | ICD-10-CM

## 2012-06-12 MED ORDER — CIPROFLOXACIN HCL 0.3 % OP SOLN: 1.0000 [drp] | OPHTHALMIC | Status: DC

## 2012-06-12 NOTE — Progress Notes (Signed)
  Subjective:    Patient ID: Laura Velez, female    DOB: 1976/09/09, 36 y.o.   MRN: [id number]  HPI Patient presents with 3 day history of left eye erythema, purulent drainage, and irritation.  She was saw her 3 year old nephew over the weekend and he was diagnosed with pink eye. Both her brother and brother-in-law now have pink eye too.  She complains of foreign body sensation and some itching.  Denies sore throat, nasal congestion, otalgia, cough, vision changes, fever or chills. She does not wear contacts or glasses.     Review of Systems  All other systems reviewed and are negative.       Objective:   Physical Exam  Constitutional: She is oriented to person, place, and time. She appears well-developed and well-nourished.  HENT:  Head: Normocephalic and atraumatic.  Right Ear: External ear normal.  Left Ear: External ear normal.  Mouth/Throat: No oropharyngeal exudate.  Eyes: EOM are normal. Pupils are equal, round, and reactive to light. Right eye exhibits no discharge. Left eye exhibits discharge (purulent). Right conjunctiva is not injected. Left conjunctiva is injected.  Neck: Normal range of motion.  Cardiovascular: Normal rate, regular rhythm and normal heart sounds.   Pulmonary/Chest: Effort normal and breath sounds normal.  Neurological: She is alert and oriented to person, place, and time.  Psychiatric: She has a normal mood and affect. Her behavior is normal. Judgment and thought content normal.          Assessment & Plan:   1. Bacterial conjunctivitis  Use Cipro ophthamic solution as directed x 7 days Recommend warm wash cloth several times a day to help with swelling and drainage Follow up if symptoms persist.

## 2012-06-12 NOTE — Patient Instructions (Addendum)
Bacterial Conjunctivitis Conjunctivitis is an irritation (inflammation) of the clear membrane that covers the white part of the eye (conjunctiva). The irritation can also happen on the underside of the eyelids. Conjunctivitis makes the eye red or pink in color. This is what is commonly known as pink eye. CAUSES   Infection from a germ (bacteria) on the surface of the eye.   Infection from the irritation or injury of nearby tissues such as the eyelids or cornea.   More serious inflammation or infection on the inside of the eye.   Other eye diseases.   The use of certain eye medications.  SYMPTOMS  The normally white color of the eye or the underside of the eyelid is usually pink or red in color. The pink eye is usually associated with irritation, tearing and some sensitivity to light. Bacterial conjunctivitis is often associated with a thick, yellowish discharge from the eye. If a discharge is present, there may also be some blurred vision in the affected eye. DIAGNOSIS  Conjunctivitis is diagnosed by an eye exam. The eye specialist looks for changes in the surface tissues of the eye which take on changes that point to the specific type of conjunctivitis. A sample of any discharge may be collected on a Q-Tip (sterile swap). The sample will be sent to a lab to see whether or not the inflammation is caused by bacterial or viral infection. TREATMENT  Bacterial conjunctivitis is treated with medicines that kill germs (antibiotics). Drops are most often used. However, antibiotic ointments are available and may be preferred by some patients. Antibiotics by mouth (oral) are sometimes used. Artificial tears or eye washes may ease discomfort. HOME CARE INSTRUCTIONS   To ease discomfort, apply a cool, clean wash cloth to the eye for 10 to 20 minutes, 3 to 4 times a day.   Gently wipe away any drainage from the eye with a warm, wet washcloth or a cotton ball.   Wash your hands often with soap. Use paper  towels to dry.   Do not share towels or wash cloths. This may spread the infection.   Change or wash your pillow case every day.   You should not use eye make-up until the infection is gone.   Do not operate machinery or drive if vision is blurred.   Stop using contacts lenses. Ask your eye professional how to sterilize or replace them before using again. This depends on the type of contact lenses used.   Do not touch the edge of the eyelid with the eye drop bottle or ointment tube when applying medications to the affected eye. This will stop you from spreading the infection to the other eye or to others. Do as your caregiver tell you.  SEEK IMMEDIATE MEDICAL CARE IF:   The infection has not improved within 3 days of beginning treatment.   A yellow discharge from the eye develops.   Pain in the eye increases.   The redness is spreading.   Vision becomes blurred.   An oral temperature above 102 F (38.9 C) develops, or as your caregiver suggests.   Facial pain, redness or swelling develops.   Any problems that may be related to the prescribed medicine develops.  MAKE SURE YOU:   Understand these instructions.   Will watch your condition.   Will get help right away if you are not doing well or get worse.  Document Released: 11/22/2005 Document Revised: 11/11/2011 Document Reviewed: 07/11/2008 ExitCare Patient Information 2012 [employer]. 

## 2012-06-15 ENCOUNTER — Telehealth: Payer: Self-pay

## 2012-06-15 MED ORDER — CIPROFLOXACIN HCL 0.3 % OP SOLN: OPHTHALMIC | Status: DC

## 2012-06-15 MED ORDER — CIPROFLOXACIN HCL 0.3 % OP SOLN: 1.0000 [drp] | OPHTHALMIC | Status: DC

## 2012-06-15 NOTE — Telephone Encounter (Signed)
Spoke with patient and she lost her eye drops and still has four days left on them.  Can we give another rx for ciprofloxacin?  Call [phone] before 5 and [phone] after 5. thanks

## 2012-06-15 NOTE — Telephone Encounter (Signed)
PT STATES SHE LOST HER EYE DROPS,NEEDS NEW RX   BEST PHONE [phone]  [facility]

## 2012-06-15 NOTE — Telephone Encounter (Signed)
New Rx sent in

## 2012-06-15 NOTE — Telephone Encounter (Signed)
Pt notified that that her rx was sent in

## 2012-09-12 ENCOUNTER — Ambulatory Visit: Payer: BC Managed Care – PPO

## 2012-10-09 ENCOUNTER — Ambulatory Visit: Payer: BC Managed Care – PPO

## 2012-10-12 ENCOUNTER — Ambulatory Visit (INDEPENDENT_AMBULATORY_CARE_PROVIDER_SITE_OTHER): Payer: PRIVATE HEALTH INSURANCE | Admitting: *Deleted

## 2012-10-12 DIAGNOSIS — Z23 Encounter for immunization: Secondary | ICD-10-CM

## 2013-08-30 ENCOUNTER — Encounter: Payer: Self-pay | Admitting: Family Medicine

## 2013-08-30 ENCOUNTER — Ambulatory Visit (INDEPENDENT_AMBULATORY_CARE_PROVIDER_SITE_OTHER): Payer: PRIVATE HEALTH INSURANCE | Admitting: Family Medicine

## 2013-08-30 VITALS — BP 100/70 | HR 82 | Temp 98.3°F | Ht 62.0 in | Wt 154.0 lb

## 2013-08-30 DIAGNOSIS — Z331 Pregnant state, incidental: Secondary | ICD-10-CM

## 2013-08-30 DIAGNOSIS — N912 Amenorrhea, unspecified: Secondary | ICD-10-CM

## 2013-08-30 DIAGNOSIS — Z3490 Encounter for supervision of normal pregnancy, unspecified, unspecified trimester: Secondary | ICD-10-CM

## 2013-08-30 DIAGNOSIS — R110 Nausea: Secondary | ICD-10-CM

## 2013-08-30 MED ORDER — PRENATAL MULTIVITAMIN CH: 1.0000 | ORAL_TABLET | Freq: Every day | ORAL | Status: DC

## 2013-08-30 MED ORDER — VITAMIN B-6 25 MG PO TABS: 25.0000 mg | ORAL_TABLET | Freq: Every day | ORAL | Status: DC

## 2013-08-30 NOTE — Progress Notes (Signed)
Patient newly dx'd OB and wants to see Drs. Piloto or [place] for OB care.  Informed patient that she will need to see different provider here Richarda Blade, [place], [place], or [place]).  Patient currently has appt with [facility] for 09/21/13.  Patient states she will keep appt with [facility] and call back here if she changes her mind.  Gaylene Brooks, RN

## 2013-08-30 NOTE — Progress Notes (Signed)
Family Medicine Office Visit Note   Subjective:   Patient ID: Laura Velez, female  DOB: Oct 15, 1976, 37 y.o.. MRN: [id number]   Pt that comes today for same-day appointment reporting amenorrhea with LMP of August 1st. She has done a urine pregnancy test at home and was positive. She comes here for confirmation.  She reports mild nausea but no other current complaints. Denies vaginal bleeding or discharge.   Review of Systems:  Per HPI   Objective:   Physical Exam: Gen:  NAD HEENT: Moist mucous membranes  CV: Regular rate and rhythm, no murmurs rubs or gallops PULM: Clear to auscultation bilaterally. No wheezes/rales/rhonchi ABD: Soft, non tender, non distended, normal bowel sounds EXT: No edema Declines pelvic exam. We can doppler FH with mild difficulty due to GA, but they are definitively present.  Assessment & Plan:

## 2013-08-30 NOTE — Patient Instructions (Addendum)
Congratulations with your pregnancy!! You can take vitamin B 6 as prescribed and also start taking prenatal tablets. Please make an appointment for your first prenatal care.

## 2013-08-30 NOTE — Assessment & Plan Note (Signed)
Positive pregnancy test and with LMP 7.6 wks today. Fetal heart tones heard but very distant and hard to obtain.  P/ Instructed to make appointment for first OB visit and appropriate labs.  Pt with mild morning sickness symptoms: Vit B6 recommended Prenatal tab also prescribed

## 2013-09-17 ENCOUNTER — Encounter: Payer: PRIVATE HEALTH INSURANCE | Admitting: Family Medicine

## 2013-09-27 ENCOUNTER — Inpatient Hospital Stay (HOSPITAL_COMMUNITY): Payer: PRIVATE HEALTH INSURANCE

## 2013-09-27 ENCOUNTER — Inpatient Hospital Stay (HOSPITAL_COMMUNITY)
Admission: AD | Admit: 2013-09-27 | Discharge: 2013-09-27 | Disposition: A | Discharge status: Home / Self Care | Payer: PRIVATE HEALTH INSURANCE | Source: Ambulatory Visit | Attending: Obstetrics & Gynecology | Admitting: Obstetrics & Gynecology

## 2013-09-27 ENCOUNTER — Encounter (HOSPITAL_COMMUNITY): Payer: Self-pay

## 2013-09-27 DIAGNOSIS — R109 Unspecified abdominal pain: Secondary | ICD-10-CM | POA: Insufficient documentation

## 2013-09-27 DIAGNOSIS — O034 Incomplete spontaneous abortion without complication: Secondary | ICD-10-CM

## 2013-09-27 HISTORY — DX: Other specified health status: Z78.9

## 2013-09-27 LAB — HCG, QUANTITATIVE, PREGNANCY: hCG, Beta Chain, Quant, S: 1754 m[IU]/mL — ABNORMAL HIGH (ref <5)

## 2013-09-27 LAB — URINALYSIS, ROUTINE W REFLEX MICROSCOPIC
Bilirubin Urine: NEGATIVE
Glucose, UA: NEGATIVE mg/dL
Ketones, ur: NEGATIVE mg/dL
Protein, ur: NEGATIVE mg/dL
pH: 6.5 (ref 5.0–8.0)

## 2013-09-27 LAB — CBC
Hemoglobin: 13 g/dL (ref 12.0–15.0)
MCH: 28.2 pg (ref 26.0–34.0)
MCV: 83.7 fL (ref 78.0–100.0)
Platelets: 262 10*3/uL (ref 150–400)
RDW: 12.9 % (ref 11.5–15.5)
WBC: 12.7 10*3/uL — ABNORMAL HIGH (ref 4.0–10.5)

## 2013-09-27 LAB — URINE MICROSCOPIC-ADD ON

## 2013-09-27 LAB — WET PREP, GENITAL
Trich, Wet Prep: NONE SEEN
WBC, Wet Prep HPF POC: NONE SEEN
Yeast Wet Prep HPF POC: NONE SEEN

## 2013-09-27 MED ORDER — OXYCODONE-ACETAMINOPHEN 5-325 MG PO TABS
2.0000 | ORAL_TABLET | Freq: Once | ORAL | Status: AC
  Administered 2013-09-27: 2 via ORAL
  Filled 2013-09-27: qty 2

## 2013-09-27 MED ORDER — HYDROMORPHONE HCL PF 2 MG/ML IJ SOLN
2.0000 mg | Freq: Once | INTRAMUSCULAR | Status: AC
  Administered 2013-09-27: 2 mg via INTRAMUSCULAR
  Filled 2013-09-27: qty 1

## 2013-09-27 MED ORDER — HYDROMORPHONE HCL PF 2 MG/ML IJ SOLN: 2.0000 mg | Freq: Once | INTRAMUSCULAR | Status: DC

## 2013-09-27 MED ORDER — ONDANSETRON 8 MG PO TBDP: 8.0000 mg | ORAL_TABLET | Freq: Once | ORAL | Status: DC | PRN

## 2013-09-27 MED ORDER — OXYCODONE-ACETAMINOPHEN 5-325 MG PO TABS: 1.0000 | ORAL_TABLET | ORAL | Status: DC | PRN

## 2013-09-27 NOTE — MAU Provider Note (Signed)
Chief Complaint: No chief complaint on file.   First Provider Initiated Contact with Patient 09/27/13 0400     SUBJECTIVE HPI: Laura Velez is a 37 y.o. G1P0 at [redacted]w[redacted]d by LMP who presents with cramping tonight and spotting since 09/23/2013 that has progressed to bleeding like a period. Seen at [facility] 08/30/2013 for pregnancy confirmation. FHTs dopplered per note, but pt states HR was 100 and she would have 7.6 by LMP. Was seen at [facility] a few days ago. RN visit and blood work only. Thinks she will go back to [facility] where she got [facility] w/ previous babies.   Concerned because she has not experienced any pregnancy changes like she did w/ previous pregnancies, but feels like her abd is "too big" for this point in the pregnancy.   Past Medical History  Diagnosis Date  . RENAL CALCULUS, HX OF 11/24/2007   OB History  Gravida Para Term Preterm AB SAB TAB Ectopic Multiple Living  1             # Outcome Date GA Lbr Len/2nd Weight Sex Delivery Anes PTL Lv  1 CUR              No past surgical history on file. History   Social History  . Marital Status: Married    Spouse Name: N/A    Number of Children: N/A  . Years of Education: N/A   Occupational History  . Not on file.   Social History Main Topics  . Smoking status: Never Smoker   . Smokeless tobacco: Not on file  . Alcohol Use: Not on file  . Drug Use: Not on file  . Sexual Activity: Not on file   Other Topics Concern  . Not on file   Social History Narrative  . No narrative on file   No current facility-administered medications on file prior to encounter.   Current Outpatient Prescriptions on File Prior to Encounter  Medication Sig Dispense Refill  . Prenatal Vit-Fe Fumarate-FA (PRENATAL MULTIVITAMIN) TABS tablet Take 1 tablet by mouth daily at 12 noon.  30 tablet  11  . vitamin B-6 (PYRIDOXINE) 25 MG tablet Take 1 tablet (25 mg total) by mouth daily.  30 tablet  2   No Known Allergies  ROS: Pertinent items in HPI. Neg for  fever, chills, N/V/D/C, urinary complaints.   OBJECTIVE Height 5\' 2"  (1.575 m), weight 72.576 kg (160 lb), last menstrual period 07/06/2013. GENERAL: Well-developed, well-nourished female in mild distress.  HEENT: Normocephalic HEART: normal rate RESP: normal effort ABDOMEN: Soft, mild low abd tenderness. Fundus non-palpable. Pos BS.  EXTREMITIES: Nontender, no edema NEURO: Alert and oriented SPECULUM EXAM: NEFG, 1 cm NT cyst on left vaginal wall.moderate amount of bloody mucus. Small amount of active bleeding. Cervix incompletely visualized, but non-friable. Appears slightly open, but no GS seen.  BIMANUAL: cervix 1.5 cm. 1-2 cm ?cyst palpated; uterus slightly enlarged, no adnexal tenderness or masses.  LAB RESULTS Results for orders placed during the hospital encounter of 09/27/13 (from the past 168 hour(s))  CBC   Collection Time    09/27/13  4:05 AM      Result Value Range   WBC 12.7 (*) 4.0 - 10.5 K/uL   RBC 4.61  3.87 - 5.11 MIL/uL   Hemoglobin 13.0  12.0 - 15.0 g/dL   HCT 40.9  81.1 - 91.4 %   MCV 83.7  78.0 - 100.0 fL   MCH 28.2  26.0 - 34.0 pg   MCHC  33.7  30.0 - 36.0 g/dL   RDW 16.1  09.6 - 04.5 %   Platelets 262  150 - 400 K/uL  HCG, QUANTITATIVE, PREGNANCY   Collection Time    09/27/13  4:05 AM      Result Value Range   hCG, Beta Chain, Quant, S 1754 (*) <5 mIU/mL  ABO/RH   Collection Time    09/27/13  4:05 AM      Result Value Range   ABO/RH(D) A POS    URINALYSIS, ROUTINE W REFLEX MICROSCOPIC   Collection Time    09/27/13  4:47 AM      Result Value Range   Color, Urine YELLOW  YELLOW   APPearance CLEAR  CLEAR   Specific Gravity, Urine <1.005 (*) 1.005 - 1.030   pH 6.5  5.0 - 8.0   Glucose, UA NEGATIVE  NEGATIVE mg/dL   Hgb urine dipstick LARGE (*) NEGATIVE   Bilirubin Urine NEGATIVE  NEGATIVE   Ketones, ur NEGATIVE  NEGATIVE mg/dL   Protein, ur NEGATIVE  NEGATIVE mg/dL   Urobilinogen, UA 0.2  0.0 - 1.0 mg/dL   Nitrite NEGATIVE  NEGATIVE    Leukocytes, UA NEGATIVE  NEGATIVE  URINE MICROSCOPIC-ADD ON   Collection Time    09/27/13  4:47 AM      Result Value Range   Squamous Epithelial / LPF RARE  RARE   RBC / HPF 21-50  <3 RBC/hpf  WET PREP, GENITAL   Collection Time    09/27/13  5:07 AM      Result Value Range   Yeast Wet Prep HPF POC NONE SEEN  NONE SEEN   Trich, Wet Prep NONE SEEN  NONE SEEN   Clue Cells Wet Prep HPF POC NONE SEEN  NONE SEEN   WBC, Wet Prep HPF POC NONE SEEN  NONE SEEN  GC/CHLAMYDIA PROBE AMP   Collection Time    09/27/13  5:07 AM      Result Value Range   CT Probe RNA NEGATIVE  NEGATIVE   GC Probe RNA NEGATIVE  NEGATIVE     IMAGING US Ob Comp Less 14 Wks  09/27/2013   CLINICAL DATA:  Spotting. Estimated gestational age by LMP is 11 weeks 6 days.  EXAM: OBSTETRIC <14 WK ULTRASOUND  TECHNIQUE: Transabdominal ultrasound was performed for evaluation of the gestation as well as the maternal uterus and adnexal regions.  COMPARISON:  None.  FINDINGS: Intrauterine gestational sac: There is a single intrauterine gestational sac which is located low in the endometrial cavity just above the lower uterine segment.  Yolk sac:  Yolk sac is not identified.  Embryo:  Fetal pole is identified. No fetal motion is observed.  Cardiac Activity: No fetal cardiac activity is observed.  Heart Rate: 0 bpm  CRL:   12.2  mm   7 w 3 d                  [place] EDC: 05/12/2012  Maternal uterus/adnexae: No subchorionic hemorrhage. No focal myometrial mass lesions. Right ovary is visualized and appears normal. Left ovary is not identified. No free pelvic fluid collections.  IMPRESSION: Single intrauterine pregnancy. Fetal movement and cardiac activity are not observed. Based on crown-rump length of greater than 7 mm and no heartbeat identified, changes are consistent with intrauterine fetal demise. Findings meet definitive criteria for failed pregnancy. This recommendation follows SRU consensus guidelines: Diagnostic Criteria for Nonviable  Pregnancy Early in the First Trimester. [facility] Mis J Med [phone].  Electronically Signed   By: Burman Nieves M.D.   On: 09/27/2013 05:01   US Ob Transvaginal  09/27/2013   CLINICAL DATA:  Spotting. Estimated gestational age by LMP is 11 weeks 6 days.  EXAM: OBSTETRIC <14 WK ULTRASOUND  TECHNIQUE: Transabdominal ultrasound was performed for evaluation of the gestation as well as the maternal uterus and adnexal regions.  COMPARISON:  None.  FINDINGS: Intrauterine gestational sac: There is a single intrauterine gestational sac which is located low in the endometrial cavity just above the lower uterine segment.  Yolk sac:  Yolk sac is not identified.  Embryo:  Fetal pole is identified. No fetal motion is observed.  Cardiac Activity: No fetal cardiac activity is observed.  Heart Rate: 0 bpm  CRL:   12.2  mm   7 w 3 d                  [place] EDC: 05/12/2012  Maternal uterus/adnexae: No subchorionic hemorrhage. No focal myometrial mass lesions. Right ovary is visualized and appears normal. Left ovary is not identified. No free pelvic fluid collections.  IMPRESSION: Single intrauterine pregnancy. Fetal movement and cardiac activity are not observed. Based on crown-rump length of greater than 7 mm and no heartbeat identified, changes are consistent with intrauterine fetal demise. Findings meet definitive criteria for failed pregnancy. This recommendation follows SRU consensus guidelines: Diagnostic Criteria for Nonviable Pregnancy Early in the First Trimester. [facility] Mis J Med [phone].   Electronically Signed   By: Burman Nieves M.D.   On: 09/27/2013 05:01   MAU COURSE Quant, ABO/Rh, CBC, [place], Wet Prep, GC/CT.   Percocet given.  1191: Still having significant cramping. Cannot tolerate. Dilaudid IM ordered. Small amount of bleeding. GS still not visible at os.   Pain improved significantly. Bleeding small amount. Ready for D/C.  ASSESSMENT 1. Incomplete miscarriage    PLAN Discharge home in  stable condition. Discussed options for management of incomplete AB including expectant management, Cytotec or D&C. Prefers expectant management at this time. Verbalizes understanding that intervention may become necessary if SAB in not completed spontaneously or if heavy bleeding or infection occur.   Bleeding/pain/fever precautions. Support given.  Bleeding/infection precautions.  Unsure about where she ultimately plans to continue Ob/gyn care, but will keep appt at [facility] for now.  Follow-up Information   Follow up with [facility] On 09/28/2013. (as scheduled)    Contact information:   [address] [place] [address] [phone]      Follow up with [facility] [place] [facility]ERN[facility] ADMISSIONS. (As needed in emergencies)    Contact information:   [address] [id number] [place] [address] [phone]       Medication List         oxyCODONE-acetaminophen 5-325 MG per tablet  Commonly known as:  PERCOCET/ROXICET  Take 1-2 tablets by mouth every 4 (four) hours as needed for pain.     prenatal multivitamin Tabs tablet  Take 1 tablet by mouth daily at 12 noon.     vitamin B-6 25 MG tablet  Commonly known as:  pyridOXINE  Take 1 tablet (25 mg total) by mouth daily.       [place], CNM 09/27/2013  3:59 AM

## 2013-09-27 NOTE — MAU Note (Signed)
Lower abdominal pain feels like "having baby" since last night. Some spotting on toilet paper earlier this week but not bleeding heavier.

## 2013-09-28 ENCOUNTER — Other Ambulatory Visit: Payer: Self-pay | Admitting: Obstetrics & Gynecology

## 2013-10-15 ENCOUNTER — Ambulatory Visit (INDEPENDENT_AMBULATORY_CARE_PROVIDER_SITE_OTHER): Payer: PRIVATE HEALTH INSURANCE | Admitting: *Deleted

## 2013-10-15 DIAGNOSIS — Z23 Encounter for immunization: Secondary | ICD-10-CM

## 2014-03-12 ENCOUNTER — Encounter: Payer: Self-pay | Admitting: Family Medicine

## 2014-03-12 ENCOUNTER — Ambulatory Visit (INDEPENDENT_AMBULATORY_CARE_PROVIDER_SITE_OTHER): Payer: BC Managed Care – PPO | Admitting: Family Medicine

## 2014-03-12 VITALS — BP 110/78 | HR 67 | Temp 98.2°F | Ht 63.0 in | Wt 165.1 lb

## 2014-03-12 DIAGNOSIS — M545 Low back pain, unspecified: Secondary | ICD-10-CM

## 2014-03-12 DIAGNOSIS — E785 Hyperlipidemia, unspecified: Secondary | ICD-10-CM

## 2014-03-12 MED ORDER — IBUPROFEN 600 MG PO TABS: 600.0000 mg | ORAL_TABLET | Freq: Three times a day (TID) | ORAL | Status: DC | PRN

## 2014-03-12 NOTE — Patient Instructions (Signed)
Please follow up in 3 months.  Start exercising 30 minutes 3-4 times per week.

## 2014-03-14 DIAGNOSIS — M545 Low back pain, unspecified: Secondary | ICD-10-CM | POA: Insufficient documentation

## 2014-03-14 DIAGNOSIS — E785 Hyperlipidemia, unspecified: Secondary | ICD-10-CM | POA: Insufficient documentation

## 2014-03-14 NOTE — Progress Notes (Signed)
Patient ID: Laura Velez    DOB: Jul 14, 1976, 38 y.o.   MRN: [id number] --- Subjective:  Laura Velez is a 38 y.o.female who presents to discuss cholesterol tests she had done at her gynecologist's office as well as discuss low back pain.  - low back pain: located across lower back, intermittent since September/oct. Occurs when she sits for a long period of time. Resolves when she stands up and walks. No lower extr weakness, no urine or bowel incontinence, no pain down legs. She uses heating pads, massage and ibuprofen which help.  - cholesterol: done last month: total 214, trig: 123, LDL: 151, HDL: 38 She doesn't exercise regularly. Diet does include vegetables. No sodas.   ROS: see HPI Past Medical History: reviewed and updated medications and allergies. Social History: Tobacco: none  Objective: Filed Vitals:   03/12/14 1547  BP: 110/78  Pulse: 67  Temp: 98.2 F (36.8 C)    Physical Examination:   General appearance - alert, well appearing, and in no distress Chest - clear to auscultation, no wheezes, rales or rhonchi, symmetric air entry Heart - normal rate, regular rhythm, normal S1, S2, no murmurs Back - no tenderness to palpation along spine or paraspinal muscles, normal extension, flexion, lateral bend of the spine, normal strength in lower extremities bilaterally, patellar reflex trace and symmetric.

## 2014-03-14 NOTE — Assessment & Plan Note (Signed)
total 214, trig: 123, LDL: 151, HDL: 38 Doesn't meet criteria for statin but patient does have a significant cardiac history with her father.  Patient to work on diet and exercise and repeat lipid in 3 months.

## 2014-03-14 NOTE — Assessment & Plan Note (Signed)
No red flags on exam or history.  - low back exercises  - increase physical activity - ibuprofen for pain - continue massage and heat

## 2014-08-08 ENCOUNTER — Ambulatory Visit: Payer: BC Managed Care – PPO | Admitting: Family Medicine

## 2014-08-28 ENCOUNTER — Ambulatory Visit (INDEPENDENT_AMBULATORY_CARE_PROVIDER_SITE_OTHER): Payer: BC Managed Care – PPO

## 2014-08-28 ENCOUNTER — Ambulatory Visit (INDEPENDENT_AMBULATORY_CARE_PROVIDER_SITE_OTHER): Payer: BC Managed Care – PPO | Admitting: Family Medicine

## 2014-08-28 VITALS — BP 109/72 | HR 69 | Temp 98.1°F | Resp 16 | Ht 62.5 in | Wt 161.4 lb

## 2014-08-28 DIAGNOSIS — M545 Low back pain, unspecified: Secondary | ICD-10-CM

## 2014-08-28 MED ORDER — MELOXICAM 7.5 MG PO TABS: 7.5000 mg | ORAL_TABLET | Freq: Every day | ORAL | Status: DC | PRN

## 2014-08-28 NOTE — Patient Instructions (Signed)
Try the mobic each morning (do not combine with other over the counter pain relievers).   Heat or ice to area as needed and the other treatments and exercises in the back care manual as tolerated. If not improving in 2-3 weeks - return for recheck. Return to the clinic or go to the nearest emergency room if any of your symptoms worsen or new symptoms occur.

## 2014-08-28 NOTE — Progress Notes (Signed)
Subjective:  This chart was scribed for Laura Staggers, MD by Laura Velez, ED Scribe. This Patient was seen in room 01 and the patients care was started at 8:18 PM   Patient ID: Laura Velez, female    DOB: 1976/03/24, 38 y.o.   MRN: [id number]  Chief Complaint  Patient presents with  . Back Pain    Lower Back pain x 6 months -[facility]-    Back Pain Pertinent negatives include no dysuria, fever, numbness or weakness.   HPI Comments: Laura Velez is a 38 y.o. female who presents to the [facility] complaining of back pain onset 6 months prior.   Here for lowe back pain. Last seen in April by Dr. Gwenlyn Velez. Advised physical activity, back exercises, ibuprofen, and massage or heat. At that time pain was noted after sitting for long period of time. No x-rays performed then. She states that the pain doesn't radiate. She denies injury or trauma to the back. She states that her pain has not improved since April. She states she takes OTC pain medication every night before bed that provides temporary relief. She states that the pain still worsens with sitting. She states she sits all day at work and has tried several things to improve her posture while sitting at work. She states that walking also provides some relief. Denies fever, unexpected weight change, no weakness, bladder.bowel incontinence, numbness, dysuria or hematuria.   Patient Active Problem List   Diagnosis Date Noted  . Low back pain 03/14/2014  . Other and unspecified hyperlipidemia 03/14/2014  . Pregnancy 08/30/2013  . Malpositioned IUD 11/19/2011  . Pharyngitis, acute 07/09/2011  . 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 RENAL CALCULUS, HX OF 11/24/2007  . Medical history non-contributory    Past Surgical History  Procedure Laterality Date  . No past surgeries     No Known Allergies Prior to Admission medications   Medication Sig Start Date End Date Taking? Authorizing  Provider  ibuprofen (ADVIL,MOTRIN) 600 MG tablet Take 1 tablet (600 mg total) by mouth every 8 (eight) hours as needed. 03/12/14  Yes Laura Skinner, MD   History   Social History  . Marital Status: Married    Spouse Name: N/A    Number of Children: N/A  . Years of Education: N/A   Occupational History  . Not on file.   Social History Main Topics  . Smoking status: Never Smoker   . Smokeless tobacco: Not on file  . Alcohol Use: No  . Drug Use: No  . Sexual Activity: Yes   Other Topics Concern  . Not on file   Social History Narrative  . No narrative on file    Review of Systems  Constitutional: Negative for fever and unexpected weight change.  Genitourinary: Negative.  Negative for dysuria and hematuria.  Musculoskeletal: Positive for back pain.  Neurological: Negative for weakness and numbness.   Objective:   BP 109/72  Pulse 69  Temp(Src) 98.1 F (36.7 C) (Oral)  Resp 16  Ht 5' 2.5" (1.588 m)  Wt 161 lb 6.4 oz (73.211 kg)  BMI 29.03 kg/m2  SpO2 100%   Physical Exam  Nursing note and vitals reviewed. Constitutional: She is oriented to person, place, and time. She appears well-developed and well-nourished. No distress.  HENT:  Head: Normocephalic and atraumatic.  Eyes: Conjunctivae and EOM are normal.  Neck: Neck supple.  Cardiovascular: Normal rate.   Pulmonary/Chest: Effort  normal. No respiratory distress.  Musculoskeletal: Normal range of motion.  lower lumbar area diffusely is the area of pain she describes butno focal tenderness, non tender on exam, no appreciable spasm   Neurological: She is alert and oriented to person, place, and time. She displays no Babinski's sign on the right side. She displays no Babinski's sign on the left side.  Reflex Scores:      Patellar reflexes are 2+ on the right side and 2+ on the left side.      Achilles reflexes are 2+ on the right side and 2+ on the left side. Negative SLR, able to heel and toe walk without  diffuctly  Skin: Skin is warm and dry.  Psychiatric: She has a normal mood and affect. Her behavior is normal.   [facility] reading (PRIMARY) by  Laura Velez. No acute findings, slight straightening of lower lumber vertebrae.    Assessment & Plan:    Laura Velez is a 38 y.o. female Low back pain without sciatica, unspecified back pain laterality - Plan: DG Lumbar Spine Complete, meloxicam (MOBIC) 7.5 MG tablet  - suspected mechanical LBP or postural with seated work. No red flags on exam.  No acute findings on LS spine XR.   -trial of HEP by back care manual, mobic if needed, continue lumbar roll or support in chair at work and Estate manager/land agent discussed with work.   -recheck in next 2-3 weeks if not improving. Sooner if worse.   Meds ordered this encounter  Medications  . meloxicam (MOBIC) 7.5 MG tablet    Sig: Take 1 tablet (7.5 mg total) by mouth daily as needed for pain.    Dispense:  30 tablet    Refill:  0   Patient Instructions   Try the mobic each morning (do not combine with other over the counter pain relievers).   Heat or ice to area as needed and the other treatments and exercises in the back care manual as tolerated. If not improving in 2-3 weeks - return for recheck. Return to the clinic or go to the nearest emergency room if any of your symptoms worsen or new symptoms occur.       I personally performed the services described in this documentation, which was scribed in my presence. The recorded information has been reviewed and considered, and addended by me as needed.

## 2014-11-18 IMAGING — US US OB COMP LESS 14 WK
1 series · 13 of 28 positions shown · non-contrast
Comparison: None.

CLINICAL DATA: Spotting. Estimated gestational age by LMP is 11
weeks 6 days.

EXAM:
OBSTETRIC <14 WK ULTRASOUND
TECHNIQUE: Transabdominal ultrasound was performed for evaluation of the
gestation as well as the maternal uterus and adnexal regions.

[Series 1: us ob comp less 14 wks · 13 of 41 slices shown]
[im 2/41]
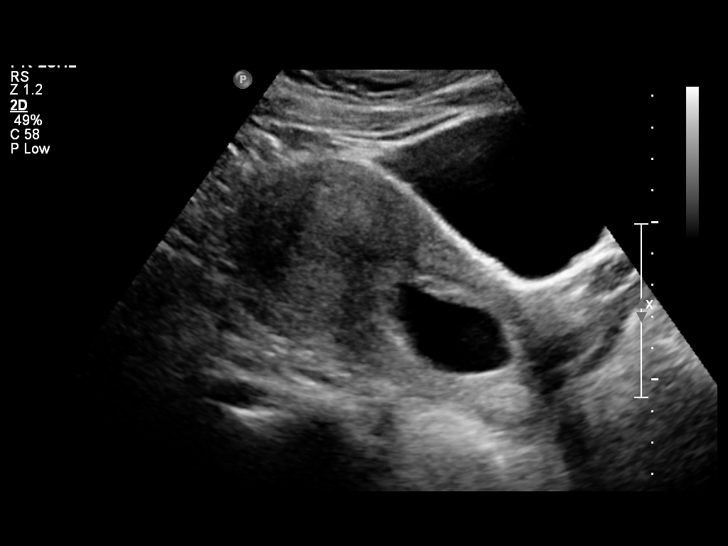
[im 5/41]
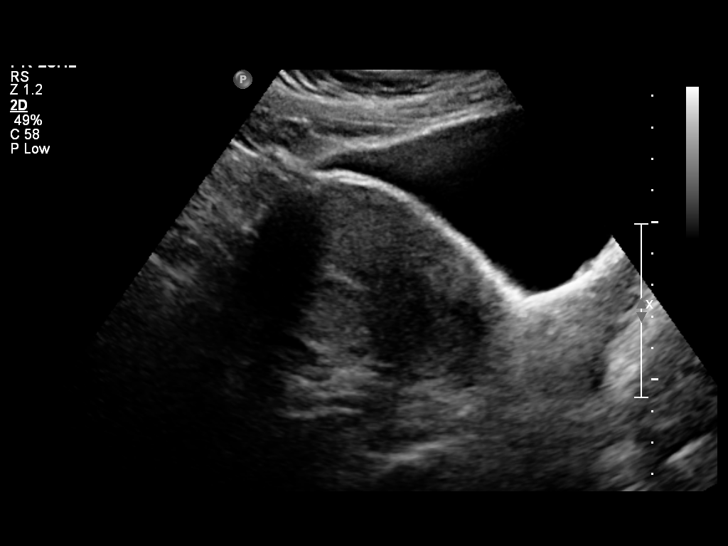
[im 8/41]
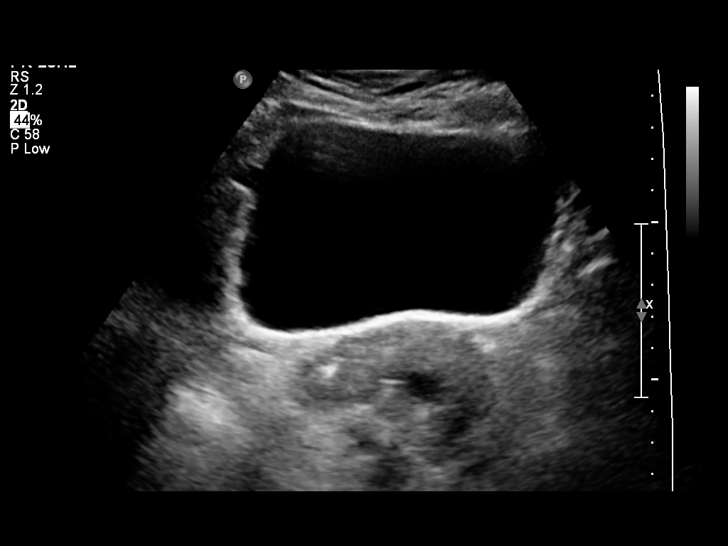
[im 11/41]
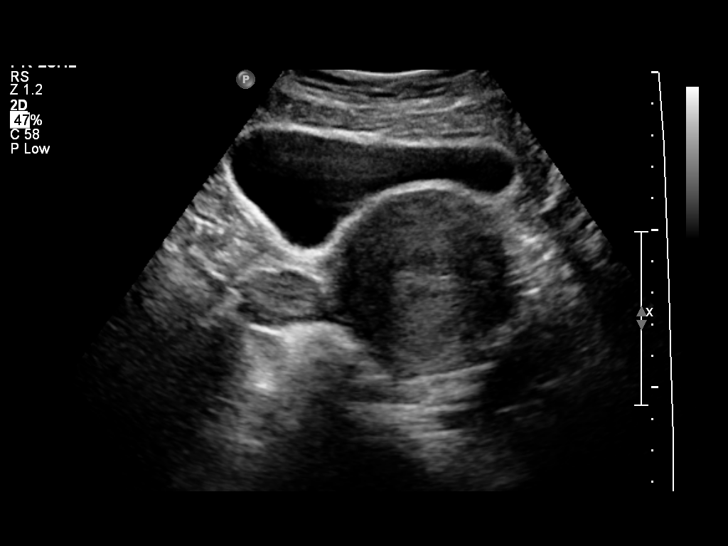
[im 14/41]
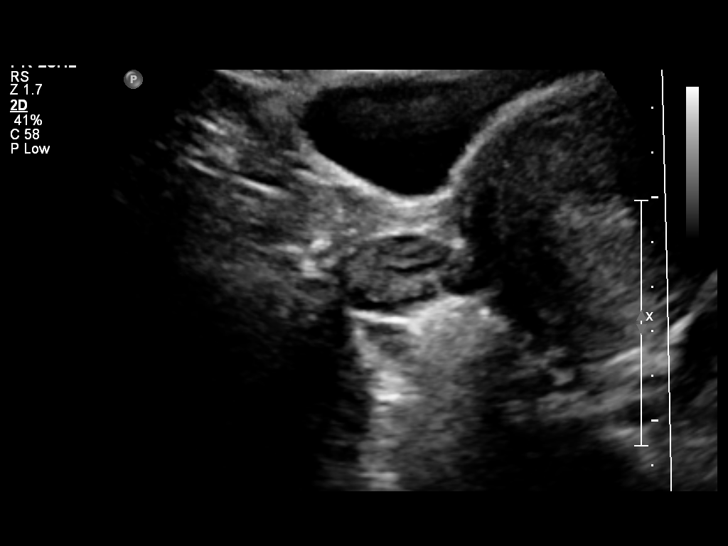
[im 17/41]
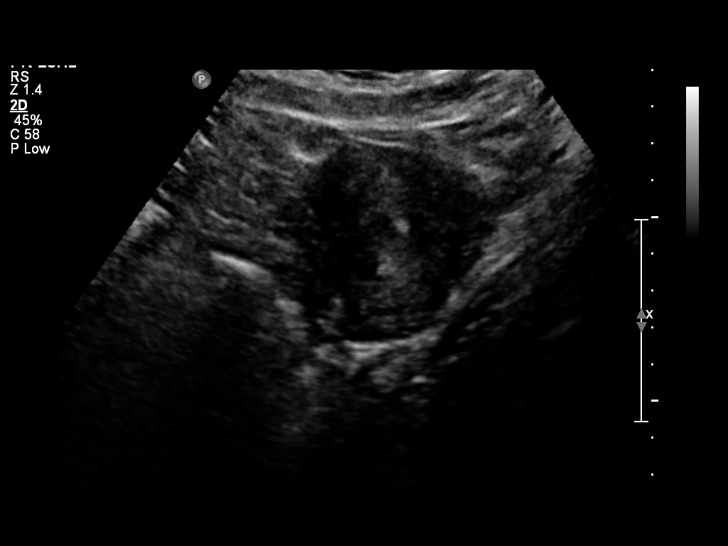
[im 21/41]
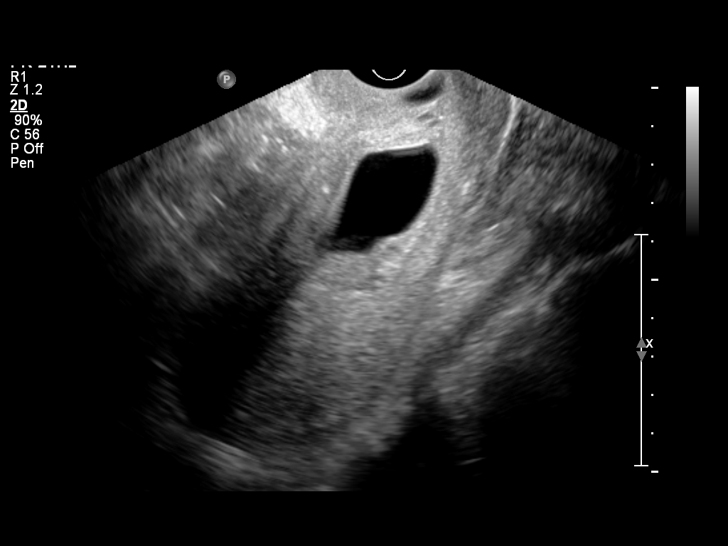
[im 24/41]
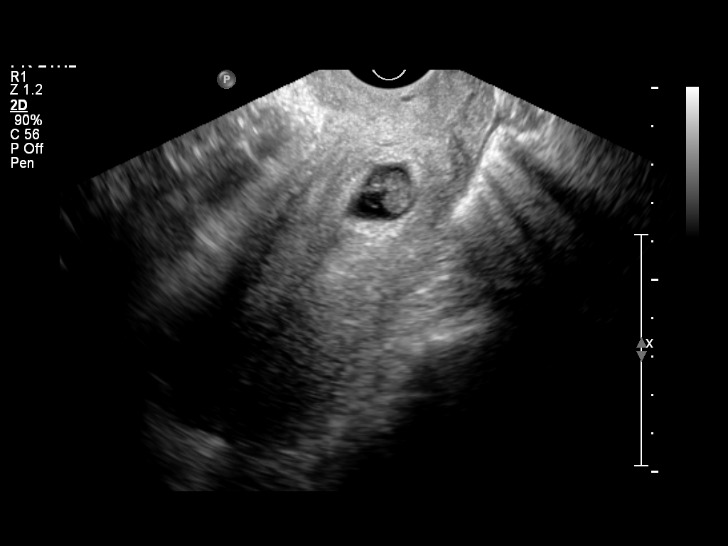
[im 27/41]
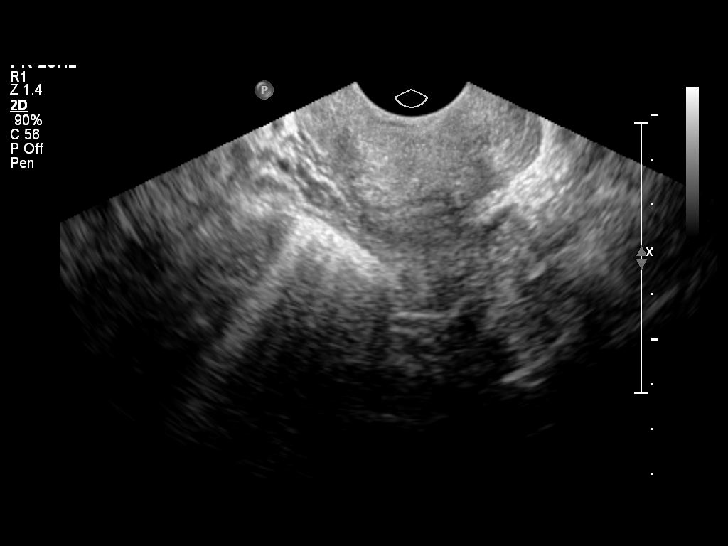
[im 30/41]
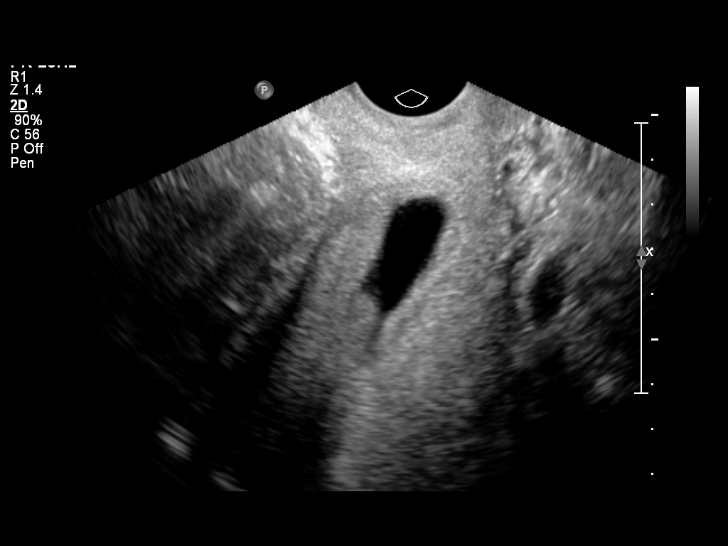
[im 33/41]
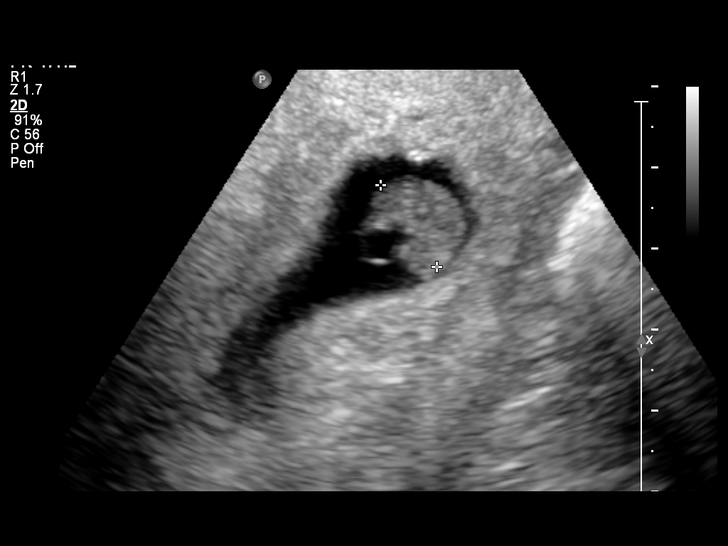
[im 36/41]
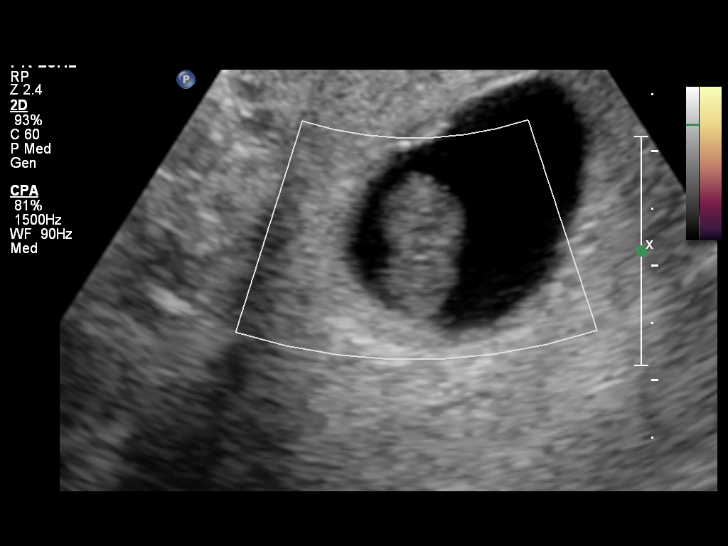
[im 39/41]
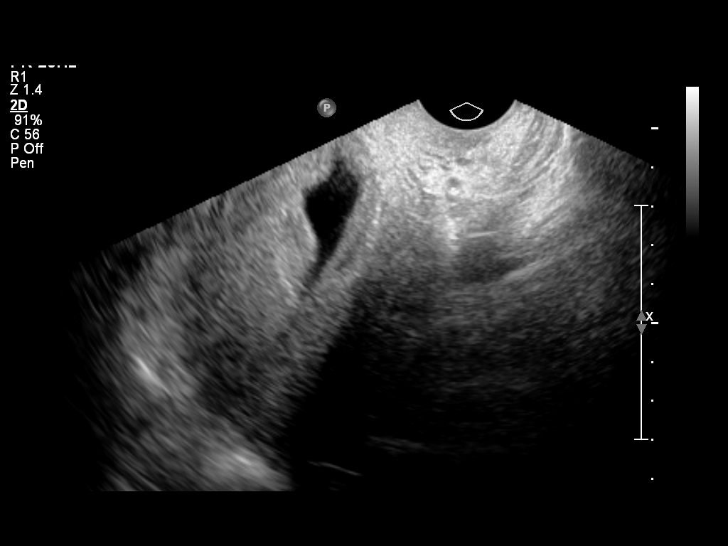

[13 of 28 positions shown; findings below may reference images not displayed]

FINDINGS: Intrauterine gestational sac: There is a single intrauterine
gestational sac which is located low in the endometrial cavity just
above the lower uterine segment.

Yolk sac:  Yolk sac is not identified.

Embryo:  Fetal pole is identified. No fetal motion is observed.

Cardiac Activity: No fetal cardiac activity is observed.

Heart Rate: 0 bpm

CRL:   12.2  mm   7 w 3 d                  US EDC: 05/12/2012

Maternal uterus/adnexae: No subchorionic hemorrhage. No focal
myometrial mass lesions. Right ovary is visualized and appears
normal. Left ovary is not identified. No free pelvic fluid
collections.
IMPRESSION: Single intrauterine pregnancy. Fetal movement and cardiac activity
are not observed. Based on crown-rump length of greater than 7 mm
and no heartbeat identified, changes are consistent with
intrauterine fetal demise. Findings meet definitive criteria for
failed pregnancy. This recommendation follows SRU consensus
guidelines: Diagnostic Criteria for Nonviable Pregnancy Early in the
First Trimester. N Engl J Med 9892;[DATE].

## 2015-03-03 ENCOUNTER — Other Ambulatory Visit (HOSPITAL_COMMUNITY)
Admission: RE | Admit: 2015-03-03 | Discharge: 2015-03-03 | Disposition: A | Discharge status: Home / Self Care | Payer: 59 | Source: Ambulatory Visit | Attending: Obstetrics and Gynecology | Admitting: Obstetrics and Gynecology

## 2015-03-03 ENCOUNTER — Other Ambulatory Visit: Payer: Self-pay | Admitting: Obstetrics and Gynecology

## 2015-03-03 DIAGNOSIS — Z1151 Encounter for screening for human papillomavirus (HPV): Secondary | ICD-10-CM | POA: Insufficient documentation

## 2015-03-03 DIAGNOSIS — Z01419 Encounter for gynecological examination (general) (routine) without abnormal findings: Secondary | ICD-10-CM | POA: Insufficient documentation

## 2015-03-05 LAB — CYTOLOGY - PAP

## 2015-03-11 ENCOUNTER — Encounter: Payer: Self-pay | Admitting: Family Medicine

## 2015-04-02 LAB — LIPID PANEL
CHOL/HDL RATIO: 5.4 — AB (ref 2.0–4.0)
Cholesterol: 192 (ref ?–200)
HDL: 38 (ref 30–85)
Ldl Cholesterol, Calc: 136 — AB (ref 0–99)
Triglycerides: 100 (ref 0–199)

## 2015-04-04 ENCOUNTER — Ambulatory Visit (INDEPENDENT_AMBULATORY_CARE_PROVIDER_SITE_OTHER): Payer: 59 | Admitting: Family Medicine

## 2015-04-04 ENCOUNTER — Encounter: Payer: Self-pay | Admitting: Family Medicine

## 2015-04-04 VITALS — BP 111/83 | HR 65 | Temp 98.7°F | Ht 64.0 in | Wt 157.0 lb

## 2015-04-04 DIAGNOSIS — Z23 Encounter for immunization: Secondary | ICD-10-CM

## 2015-04-04 DIAGNOSIS — Z114 Encounter for screening for human immunodeficiency virus [HIV]: Secondary | ICD-10-CM | POA: Diagnosis not present

## 2015-04-04 DIAGNOSIS — E785 Hyperlipidemia, unspecified: Secondary | ICD-10-CM | POA: Diagnosis not present

## 2015-04-04 DIAGNOSIS — M545 Low back pain, unspecified: Secondary | ICD-10-CM

## 2015-04-04 LAB — BASIC METABOLIC PANEL WITH GFR
BUN: 9 mg/dL (ref 6–23)
CO2: 22 mEq/L (ref 19–32)
Calcium: 9.3 mg/dL (ref 8.4–10.5)
Chloride: 103 mEq/L (ref 96–112)
Creat: 0.61 mg/dL (ref 0.50–1.10)
GLUCOSE: 77 mg/dL (ref 70–99)
POTASSIUM: 3.7 meq/L (ref 3.5–5.3)
Sodium: 135 mEq/L (ref 135–145)

## 2015-04-04 MED ORDER — IBUPROFEN 600 MG PO TABS: 600.0000 mg | ORAL_TABLET | Freq: Three times a day (TID) | ORAL | Status: DC | PRN

## 2015-04-04 NOTE — Patient Instructions (Signed)
Dear Jani Gravelham Korzeniewski, Thank you for coming in to clinic today. It was good to see you!  1. Overall it sounds like you are doing very well. 2. Your cholesterol is elevated, but you do not meet requirements to start cholesterol lowering medicine. Most important thing is to start regular exercise with walking at least 30 mins several days a week. Stay active. 3. Keep up healthy diet as you are, plenty of water. I think you are doing great! 4. Blood pressure is good today 5. Refilled Ibuprofen for back pain - use as needed, let me know if using it too often 6. Due for basic blood work today, and Tetanus shot - we will mail you letter with results  Some important numbers from today's visit: BP - 111/83 Results -  Please schedule a follow-up appointment with Dr. Althea CharonKaramalegos in 1 year for next Annual Physical exam.  If you have any other questions or concerns, please feel free to call the clinic to contact me. You may also schedule an earlier appointment if necessary.  However, if your symptoms get significantly worse, please go to the Emergency Department to seek immediate medical attention.  Saralyn PilarAlexander Tedra Coppernoll, DO [facility]

## 2015-04-04 NOTE — Progress Notes (Signed)
   Subjective:    Patient ID: Laura Velez, female    DOB: 06-16-1976, 39 y.o.   MRN: [id number]  Patient presents for annual physical and to follow-up cholesterol lab work from OB/GYN HPI   HYPERLIPIDEMIA: - History of elevated cholesterol in past, previously last lipid panel 2011. Recently checked by OB/GYN in 03/2015, pt presents to discuss results - Currently not on any cholesterol lowering medications, never been on statin. No significant family history of cholesterol disease or early heart disease - Lifestyle - trying to reduce portion size, limiting lunch daily. Stays active but limited regular aerobic exercise - Admits intentional wt loss 8 lbs in 1 year  OB/GYN: - Followed by Dr. Dion BodyVarnado - [phone] - Currently with IUD in place  Family History: - Cardiac disease - Father (MI age 40>60 yr, now s/p CABG)  HM: - Due TDap today (received) - Due routine HIV screening (non-reactive) - Last pap smear 03/03/15 (OB/GYN Varn[facility], negative and no HRHPV detected) good for 3-5 years  I have reviewed and updated the following as appropriate: allergies and current medications  Social Hx: - Denies tobacco, alcohol, drugs - [place] at [employer] [place]  Review of Systems  See above HPI    Objective:   Physical Exam  BP 111/83 mmHg  Pulse 65  Temp(Src) 98.7 F (37.1 C) (Oral)  Ht 5\' 4"  (1.626 m)  Wt 157 lb (71.215 kg)  BMI 26.94 kg/m2  Gen - well-appearing, pleasant, NAD HEENT - NCAT, PERRL, EOMI, patent nares w/o congestion, oropharynx clear, MMM Neck - supple, non-tender, no LAD, no thyromegaly Heart - RRR, no murmurs heard Lungs - CTAB, no wheezing, crackles, or rhonchi. Normal work of breathing. Ext - non-tender, no edema, peripheral pulses intact +2 b/l Skin - warm, dry, no rashes Neuro - awake, alert     Assessment & Plan:   See specific A&P problem list for details.

## 2015-04-05 LAB — HIV ANTIBODY (ROUTINE TESTING W REFLEX): HIV 1&2 Ab, 4th Generation: NONREACTIVE

## 2015-04-05 NOTE — Assessment & Plan Note (Addendum)
Improved, intermittent chronic LBP - Request refill on Ibuprofen 600mg  infrequent PRN use - Check BMET - normal Cr 0.67

## 2015-04-05 NOTE — Assessment & Plan Note (Addendum)
Stable to improved lipid panel - 03/11/15 - TC 192, TG 100 (down from 160), HDL 38 (low), LDL 136  Plan: 1. No recommendation for statin or cholesterol lowering medication. ASCVD 10 yr risk calc 0.8% (baseline, 0.4%). Reviewed with patient. 2. Continue improved diet, and encouraged starting regular exercise with walking to improve HDL 3. Check BMET - unremarkable 4. Repeat fasting lipid panel within 1-2 years

## 2015-04-07 ENCOUNTER — Encounter: Payer: Self-pay | Admitting: Family Medicine

## 2015-06-18 ENCOUNTER — Ambulatory Visit: Payer: 59

## 2015-06-18 ENCOUNTER — Ambulatory Visit (INDEPENDENT_AMBULATORY_CARE_PROVIDER_SITE_OTHER): Payer: 59 | Admitting: Family Medicine

## 2015-06-18 VITALS — BP 108/71 | HR 70 | Temp 98.1°F | Wt 158.5 lb

## 2015-06-18 DIAGNOSIS — J020 Streptococcal pharyngitis: Secondary | ICD-10-CM

## 2015-06-18 LAB — POCT RAPID STREP A (OFFICE): Rapid Strep A Screen: NEGATIVE

## 2015-06-18 MED ORDER — PHENYLEPH-PROMETHAZINE-COD 5-6.25-10 MG/5ML PO SYRP: 5.0000 mL | ORAL_SOLUTION | Freq: Four times a day (QID) | ORAL | Status: DC | PRN

## 2015-06-18 NOTE — Patient Instructions (Signed)
It was a pleasure seeing you today, Laura Velez.  Information regarding what we discussed is included in this packet.  Take Motrin for sore throat as directed.  Also a prescription for a cough syrup has been given to you.  Caution it may cause you to be sleepy.  Please feel free to call our office at [phone] if any questions or concerns arise.  Warm Regards, Shukri Nistler M. Megin Consalvo, DO   Cough, Adult  A cough is a reflex. It helps you clear your throat and airways. A cough can help heal your body. A cough can last 2 or 3 weeks (acute) or may last more than 8 weeks (chronic). Some common causes of a cough can include an infection, allergy, or a cold. HOME CARE  Only take medicine as told by your doctor.  If given, take your medicines (antibiotics) as told. Finish them even if you start to feel better.  Use a cold steam vaporizer or humidifier in your home. This can help loosen thick spit (secretions).  Sleep so you are almost sitting up (semi-upright). Use pillows to do this. This helps reduce coughing.  Rest as needed.  Stop smoking if you smoke. GET HELP RIGHT AWAY IF:  You have yellowish-white fluid (pus) in your thick spit.  Your cough gets worse.  Your medicine does not reduce coughing, and you are losing sleep.  You cough up blood.  You have trouble breathing.  Your pain gets worse and medicine does not help.  You have a fever. MAKE SURE YOU:   Understand these instructions.  Will watch your condition.  Will get help right away if you are not doing well or get worse. Document Released: 08/05/2011 Document Revised: 04/08/2014 Document Reviewed: 08/05/2011 [facility] Patient Information 2015 [employer] [place]. This information is not intended to replace advice given to you by your health care provider. Make sure you discuss any questions you have with your health care provider.

## 2015-06-18 NOTE — Progress Notes (Signed)
Patient ID: Laura Velez, female   DOB: [birth year], 39 y.o.   MRN: [id number]    Subjective: WU:JWJXCC:sore throat HPI: Patient is a 39 y.o. female presenting to clinic today for same day appointment. Concerns today include:  1. Sore throat, cough Patient reports a 1.5 week h/o sore throat and cough.  She has used Delsym, honey/lemon, warm, ginger/salt with no relief.  She had a stuffy nose that went away.  Also taking an OTC allergy medicine that is not helping.  She denies rhinorrhea, SOB, CP, fevers, chills, nausea, vomiting, rashes, sick contact.    Social History Reviewed: non smoker. FamHx and MedHx updated.  Please see EMR.  ROS: All other systems reviewed and are negative.  Objective: Office vital signs reviewed. BP 108/71 mmHg  Pulse 70  Temp(Src) 98.1 F (36.7 C) (Oral)  Wt 158 lb 8 oz (71.895 kg)  Physical Examination:  General: Awake, alert, well nourished, NAD HEENT: Normal    Neck: No masses palpated. No cervical LAD    Ears: R TM intact, normal light reflex, no erythema, no bulging; L TM occluded by cerumen    Eyes: EOMI    Nose: nasal turbinates moist    Throat: MMM, no erythema, no tonsilar exudate Pulm: CTAB, no wheezes, rhonchi or rales, normal work of breathing Skin: no rashes  Results for orders placed or performed in visit on 06/18/15 (from the past 24 hour(s))  POCT rapid strep A     Status: None   Collection Time: 06/18/15  9:00 AM  Result Value Ref Range   Rapid Strep A Screen Negative Negative   Assessment: 39 y.o. female with sore throat, cough.  Likely 2/2 allergies vs viral etiology (no fevers, sick contacts)  Plan: -Rapid Strep negative -Phenergan VC 1tsp po q6 PRN cough/sore throat -Motrin q6 PRN sore throat -Rest, hydration -Work note for today given -Return precautions reviewed -F/u PRN worsening symptoms  Raliegh IpAshly M Gottschalk, DO PGY-2, [facility]

## 2015-09-02 ENCOUNTER — Ambulatory Visit: Payer: 59 | Admitting: Family Medicine

## 2015-10-01 ENCOUNTER — Ambulatory Visit (INDEPENDENT_AMBULATORY_CARE_PROVIDER_SITE_OTHER): Payer: 59 | Admitting: Family Medicine

## 2015-10-01 ENCOUNTER — Encounter: Payer: Self-pay | Admitting: Family Medicine

## 2015-10-01 ENCOUNTER — Ambulatory Visit: Payer: 59 | Admitting: Family Medicine

## 2015-10-01 VITALS — BP 107/62 | HR 59 | Temp 97.9°F | Wt 157.6 lb

## 2015-10-01 DIAGNOSIS — F411 Generalized anxiety disorder: Secondary | ICD-10-CM | POA: Diagnosis not present

## 2015-10-01 MED ORDER — CITALOPRAM HYDROBROMIDE 40 MG PO TABS: 20.0000 mg | ORAL_TABLET | Freq: Every day | ORAL | Status: DC

## 2015-10-01 NOTE — Patient Instructions (Signed)
Start Celexa: Take 1/2 pill (20mg ) once a day for 2 weeks, then increase to 1 pill (40mg ) until you see Dr Althea CharonKaramalegos.

## 2015-10-01 NOTE — Progress Notes (Signed)
   Subjective:    Patient ID: Laura Velez, female    DOB: 06/20/1976, 39 y.o.   MRN: [id number]  Seen for Same day visit for   CC: stress  She reports great deal of of anxiety for the past year, mostly secondary to financial and legal stress around her husband being "ripped off". She reports difficulty sleeping at night due to worry, poor appetite and decrease mood. She denies weight loss. Denies SI/HI. No previous mood disorders or anxiety medications.   Review of Systems   See HPI for ROS. Objective:  BP 107/62 mmHg  Pulse 59  Temp(Src) 97.9 F (36.6 C) (Oral)  Wt 157 lb 9.6 oz (71.487 kg)  General: NAD Neck: thyroid nonpalpable  Cardiac: RRR, normal heart sounds, no murmurs. 2+ radial and PT pulses bilaterally Respiratory: CTAB, normal effort    Assessment & Plan:   Anxiety state Patient request "anxiety medication" - Start Celexa 20mg  qd, increased to 40mg  qd after 2 weeks; advised patient that her anxiety is highly situational and will not improve this, but could help her cope better.  - f/u with PCP in 4 weeks

## 2015-10-01 NOTE — Assessment & Plan Note (Signed)
Patient request "anxiety medication" - Start Celexa 20mg  qd, increased to 40mg  qd after 2 weeks; advised patient that her anxiety is highly situational and will not improve this, but could help her cope better.  - f/u with PCP in 4 weeks

## 2015-10-27 ENCOUNTER — Ambulatory Visit (INDEPENDENT_AMBULATORY_CARE_PROVIDER_SITE_OTHER): Payer: 59 | Admitting: Family Medicine

## 2015-10-27 ENCOUNTER — Encounter: Payer: Self-pay | Admitting: Family Medicine

## 2015-10-27 VITALS — BP 131/77 | HR 58 | Temp 98.3°F | Ht 64.0 in | Wt 155.0 lb

## 2015-10-27 DIAGNOSIS — F411 Generalized anxiety disorder: Secondary | ICD-10-CM | POA: Diagnosis not present

## 2015-10-27 DIAGNOSIS — Z23 Encounter for immunization: Secondary | ICD-10-CM | POA: Diagnosis not present

## 2015-10-27 MED ORDER — CITALOPRAM HYDROBROMIDE 40 MG PO TABS: 40.0000 mg | ORAL_TABLET | Freq: Every day | ORAL | Status: DC

## 2015-10-27 NOTE — Patient Instructions (Signed)
Thank you for coming in to clinic today.  1. I'm encouraged that your symptoms will improve once your court case comes and goes. As we discussed, once your problems are addressed and you are able to reduce this stress in your life, I think that your Anxiety, Stress, Fear, and difficulty sleeping will improve. 2. Refilled your Celexa 40mg  tabs - take one whole tab daily for next 2 to 3 months. This should continue to work, and some patients need over 1 month on this medicine (full dose 40mg ) for it to take full effect. 3. You may try over the counter, Tylenol PM as needed to help sleep at night. 4. Try to stay active, healthy lifestyle and exercise will help reduce stress on your body and mind  Flu shot today.  Please schedule a follow-up appointment with Dr Althea CharonKaramalegos in 1 to 3 months to follow-up Anxiety, may not need this medicine anymore, or may switch medications.  If you have any other questions or concerns, please feel free to call the clinic to contact me. You may also schedule an earlier appointment if necessary.  However, if your symptoms get significantly worse, please go to the Emergency Department to seek immediate medical attention.  Saralyn PilarAlexander Karamalegos, DO [facility]

## 2015-10-27 NOTE — Progress Notes (Signed)
Subjective:    Patient ID: Laura Velez, female    DOB: 1976/06/26, 39 y.o.   MRN: [id number]  Laura Gravelham Paolucci is a 39 y.o. female presenting on 10/27/2015 for Follow-up: Anxiety, insomnia  HPI   ANXIETY / INSOMNIA: - [facility] 10/01/15 for this complaint, newly reported at that time of anxiety - Reports that she started the Celexa recently as prescribed 40mg  tabs, started by cutting in half 20mg  x 2 weeks, and now has taken whole tab 40mg  for past 1 week. Tolerating this fine, but she is now concerned that she "feels tired" and medicine may be making her "sleepy" but her mind is racing at night. However she still is able to go sleep. - Describes that her financial family stress situation has been going on for about 2 years, but acutely stressful within past few weeks with impending court date for her husband coming up next week. She was advised by her friend to come to doctor to get a "medication". She is religious and does a lot of prayer on daily basis, and is optimistic that her anxiety and stress will improve over next few weeks. - PHQ-9: 0 (not at all) - Admits improved appetite - Denies an panic attacks, chest pain, shortness of breath, diaphoresis, nausea / vomiting  HM: Due for Flu shot (received)  Past Medical History  Diagnosis Date  . RENAL CALCULUS, HX OF 11/24/2007  . Medical history non-contributory     Social History   Social History  . Marital Status: Married    Spouse Name: N/A  . Number of Children: N/A  . Years of Education: N/A   Occupational History  . Not on file.   Social History Main Topics  . Smoking status: Never Smoker   . Smokeless tobacco: Not on file  . Alcohol Use: No  . Drug Use: No  . Sexual Activity: Yes   Other Topics Concern  . Not on file   Social History Narrative    Current Outpatient Prescriptions on File Prior to Visit  Medication Sig  . ibuprofen (ADVIL,MOTRIN) 600 MG tablet Take 1 tablet (600 mg total) by mouth every 8  (eight) hours as needed. (Patient not taking: Reported on 10/27/2015)   No current facility-administered medications on file prior to visit.    Review of Systems  Constitutional: Negative for fever, chills, diaphoresis, activity change, appetite change and fatigue.  Respiratory: Negative for shortness of breath.   Cardiovascular: Negative for chest pain and palpitations.  Gastrointestinal: Negative for nausea, vomiting, abdominal pain and diarrhea.  Neurological: Negative for headaches.  Psychiatric/Behavioral: Positive for sleep disturbance. Negative for suicidal ideas, hallucinations, behavioral problems, confusion, self-injury, dysphoric mood and agitation. The patient is nervous/anxious.    Per HPI unless specifically indicated above     Objective:    BP 131/77 mmHg  Pulse 58  Temp(Src) 98.3 F (36.8 C) (Oral)  Ht 5\' 4"  (1.626 m)  Wt 155 lb (70.308 kg)  BMI 26.59 kg/m2  Breastfeeding? Unknown  Wt Readings from Last 3 Encounters:  10/27/15 155 lb (70.308 kg)  10/01/15 157 lb 9.6 oz (71.487 kg)  06/18/15 158 lb 8 oz (71.895 kg)    Physical Exam  Constitutional: She is oriented to person, place, and time. She appears well-developed and well-nourished. No distress.  Neurological: She is alert and oriented to person, place, and time.  Skin: Skin is warm and dry. She is not diaphoretic.  Psychiatric: She has a normal mood and affect. Her behavior  is normal.  Nursing note and vitals reviewed.  Results for orders placed or performed in visit on 06/18/15  POCT rapid strep A  Result Value Ref Range   Rapid Strep A Screen Negative Negative      Assessment & Plan:   Problem List Items Addressed This Visit      Other   Anxiety state - Primary    Stable without worsening, on Celexa  daily now (titrated up as advised over past 3 weeks). Still some insomnia with persistent worrying about multiple things, mainly financial upcoming court date. - PHQ9: 0, no depressive  symptoms. Without panic attacks or other manifesting symptoms  Plan: 1. Continue Celexa  daily - take in morning, not PM. Refilled #30 and +2 refills. Discussed would recommend 1 full month on full dose , but can continue to take until follow-up. 2. Advised improve exercise, continue prayer, family support 3. May try OTC Tylenol PM PRN insomnia. Consider adding Hydroxyzine PRN anxiety / insomnia if more episodic or acute flares 4. Recommend integrative care follow-up, can schedule this or may follow-up with them after next apt. Will send msg to Dr Pascal Lux 5. RTC 1 to 3 months for anxiety follow-up. Consider switch to Lexapro if too drowsy on celexa and improving her anxiety, otherwise can add Buspar 7.5mg  BID and titrate to 15-30mg  for augmenting anxiety therapy if more concerned for GAD with worrying. Additionally, may be self limited due to acute known stressor, and may not need any anti-anxiety therapy.      Relevant Medications   citalopram (CELEXA) 40 MG tablet    Other Visit Diagnoses    Encounter for immunization           Meds ordered this encounter  Medications  . citalopram (CELEXA) 40 MG tablet    Sig: Take 1 tablet (40 mg total) by mouth daily.    Dispense:  30 tablet    Refill:  2      Follow up plan: No Follow-up on file.  A total of 15 minutes was spent face-to-face with this patient. Over half this time was spent on counseling patient on the diagnosis and different diagnostic and therapeutic options available.  Saralyn Pilar, DO [facility], PGY-3

## 2015-10-27 NOTE — Assessment & Plan Note (Signed)
Stable without worsening, on Celexa 40mg  daily now (titrated up as advised over past 3 weeks). Still some insomnia with persistent worrying about multiple things, mainly financial upcoming court date. - PHQ9: 0, no depressive symptoms. Without panic attacks or other manifesting symptoms  Plan: 1. Continue Celexa 40mg  daily - take in morning, not PM. Refilled #30 and +2 refills. Discussed would recommend 1 full month on full dose 40mg , but can continue to take until follow-up. 2. Advised improve exercise, continue prayer, family support 3. May try OTC Tylenol PM PRN insomnia. Consider adding Hydroxyzine PRN anxiety / insomnia if more episodic or acute flares 4. Recommend integrative care follow-up, can schedule this or may follow-up with them after next apt. Will send msg to Dr Pascal LuxKane 5. RTC 1 to 3 months for anxiety follow-up. Consider switch to Lexapro if too drowsy on celexa and improving her anxiety, otherwise can add Buspar 7.5mg  BID and titrate to 15-30mg  for augmenting anxiety therapy if more concerned for GAD with worrying. Additionally, may be self limited due to acute known stressor, and may not need any anti-anxiety therapy.

## 2016-08-19 ENCOUNTER — Ambulatory Visit (INDEPENDENT_AMBULATORY_CARE_PROVIDER_SITE_OTHER): Payer: Self-pay | Admitting: *Deleted

## 2016-08-19 DIAGNOSIS — Z23 Encounter for immunization: Secondary | ICD-10-CM

## 2016-11-17 ENCOUNTER — Encounter (HOSPITAL_COMMUNITY): Payer: Self-pay | Admitting: Emergency Medicine

## 2016-11-17 ENCOUNTER — Ambulatory Visit (HOSPITAL_COMMUNITY)
Admission: EM | Admit: 2016-11-17 | Discharge: 2016-11-17 | Disposition: A | Discharge status: Home / Self Care | Payer: BLUE CROSS/BLUE SHIELD | Attending: Family Medicine | Admitting: Family Medicine

## 2016-11-17 DIAGNOSIS — J208 Acute bronchitis due to other specified organisms: Secondary | ICD-10-CM

## 2016-11-17 MED ORDER — AZITHROMYCIN 250 MG PO TABS: ORAL_TABLET | ORAL | 0 refills | Status: DC

## 2016-11-17 MED ORDER — HYDROCODONE-HOMATROPINE 5-1.5 MG/5ML PO SYRP: 5.0000 mL | ORAL_SOLUTION | Freq: Four times a day (QID) | ORAL | 0 refills | Status: DC | PRN

## 2016-11-17 NOTE — ED Triage Notes (Signed)
Cough for 2- 2 1/2 weeks.  Reports coughing up yellow phlegm.  Patient reports coughing makes throat hurt.

## 2016-11-17 NOTE — ED Provider Notes (Signed)
[facility]    CSN: [id number] Arrival date & time: 11/17/16  1830     History   Chief Complaint Chief Complaint  Patient presents with  . Cough    HPI Laura Velez is a 40 y.o. female.   The history is provided by the patient.  Cough  Cough characteristics:  Productive Sputum characteristics:  Yellow Severity:  Moderate Onset quality:  Gradual Duration:  3 weeks Progression:  Unchanged Chronicity:  New Smoker: no   Context: sick contacts   Context comment:  Father here and similar sick. Associated symptoms: rhinorrhea and sore throat   Associated symptoms: no chest pain and no fever     Past Medical History:  Diagnosis Date  . Medical history non-contributory   . RENAL CALCULUS, HX OF 11/24/2007    Patient Active Problem List   Diagnosis Date Noted  . Anxiety state 10/01/2015  . Low back pain 03/14/2014  . Hyperlipidemia 03/14/2014  . Malpositioned IUD 11/19/2011  . 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 NO PAST SURGERIES      OB History    Gravida Para Term Preterm AB Living   5 3 3   1 3    SAB TAB Ectopic Multiple Live Births   1       3       Home Medications    Prior to Admission medications   Medication Sig Start Date End Date Taking? Authorizing Provider  citalopram (CELEXA) 40 MG tablet Take 1 tablet (40 mg total) by mouth daily. Patient not taking: Reported on 11/17/2016 10/27/15   Smitty CordsAlexander J Karamalegos, DO  ibuprofen (ADVIL,MOTRIN) 600 MG tablet Take 1 tablet (600 mg total) by mouth every 8 (eight) hours as needed. Patient not taking: Reported on 10/27/2015 04/04/15   Smitty CordsAlexander J Karamalegos, DO    Family History Family History  Problem Relation Age of Onset  . Kidney Stones Mother   . Heart disease Father     Social History Social History  Substance Use Topics  . Smoking status: Never Smoker  . Smokeless tobacco: Not on file  . Alcohol use No     Allergies     Patient has no known allergies.   Review of Systems Review of Systems  Constitutional: Negative.  Negative for fever.  HENT: Positive for congestion, postnasal drip, rhinorrhea and sore throat.   Respiratory: Positive for cough.   Cardiovascular: Negative for chest pain.  Gastrointestinal: Negative.   Genitourinary: Negative.   All other systems reviewed and are negative.    Physical Exam Triage Vital Signs ED Triage Vitals [11/17/16 1906]  Enc Vitals Group     BP 137/80     Pulse Rate 97     Resp 18     Temp 98.1 F (36.7 C)     Temp Source Oral     SpO2 97 %     Weight      Height      Head Circumference      Peak Flow      Pain Score      Pain Loc      Pain Edu?      Excl. in GC?    No data found.   Updated Vital Signs BP 137/80 (BP Location: Left Arm)   Pulse 97   Temp 98.1 F (36.7 C) (Oral)   Resp 18   SpO2 97%   Visual Acuity Right Eye Distance:  Left Eye Distance:   Bilateral Distance:    Right Eye Near:   Left Eye Near:    Bilateral Near:     Physical Exam  Constitutional: She appears well-developed and well-nourished. No distress.  HENT:  Right Ear: External ear normal.  Left Ear: External ear normal.  Nose: Nose normal.  Mouth/Throat: Oropharynx is clear and moist.  Neck: Normal range of motion. Neck supple.  Cardiovascular: Normal rate, regular rhythm, normal heart sounds and intact distal pulses.   Pulmonary/Chest: Effort normal and breath sounds normal.  Lymphadenopathy:    She has no cervical adenopathy.  Neurological: She is alert.  Skin: Skin is warm and dry.  Nursing note and vitals reviewed.    UC Treatments / Results  Labs (all labs ordered are listed, but only abnormal results are displayed) Labs Reviewed - No data to display  EKG  EKG Interpretation None       Radiology No results found.  Procedures Procedures (including critical care time)  Medications Ordered in UC Medications - No data to  display   Initial Impression / Assessment and Plan / UC Course  I have reviewed the triage vital signs and the nursing notes.  Pertinent labs & imaging results that were available during my care of the patient were reviewed by me and considered in my medical decision making (see chart for details).  Clinical Course       Final Clinical Impressions(s) / UC Diagnoses   Final diagnoses:  None    New Prescriptions New Prescriptions   No medications on file     Linna HoffJames D Lexington Devine, MD 11/17/16 [id number]

## 2017-09-27 ENCOUNTER — Ambulatory Visit (INDEPENDENT_AMBULATORY_CARE_PROVIDER_SITE_OTHER): Payer: BLUE CROSS/BLUE SHIELD | Admitting: *Deleted

## 2017-09-27 ENCOUNTER — Telehealth: Payer: Self-pay | Admitting: Family Medicine

## 2017-09-27 DIAGNOSIS — Z23 Encounter for immunization: Secondary | ICD-10-CM

## 2017-09-27 NOTE — Telephone Encounter (Signed)
Left message for mom to call back regarding Meningitis [facility]. The only part of the vaccine given was the saline, so he will need to come back and receive the vaccine in full.

## 2018-01-04 ENCOUNTER — Ambulatory Visit (HOSPITAL_COMMUNITY)
Admission: RE | Admit: 2018-01-04 | Discharge: 2018-01-04 | Disposition: A | Discharge status: Home / Self Care | Payer: BLUE CROSS/BLUE SHIELD | Source: Ambulatory Visit | Attending: Family Medicine | Admitting: Family Medicine

## 2018-01-04 ENCOUNTER — Ambulatory Visit: Payer: BLUE CROSS/BLUE SHIELD | Admitting: Family Medicine

## 2018-01-04 ENCOUNTER — Other Ambulatory Visit: Payer: Self-pay

## 2018-01-04 VITALS — BP 118/78 | HR 80 | Temp 98.3°F | Ht 64.0 in | Wt 165.4 lb

## 2018-01-04 DIAGNOSIS — G8929 Other chronic pain: Secondary | ICD-10-CM | POA: Diagnosis not present

## 2018-01-04 DIAGNOSIS — M47896 Other spondylosis, lumbar region: Secondary | ICD-10-CM | POA: Insufficient documentation

## 2018-01-04 DIAGNOSIS — M545 Low back pain, unspecified: Secondary | ICD-10-CM

## 2018-01-04 DIAGNOSIS — Z0000 Encounter for general adult medical examination without abnormal findings: Secondary | ICD-10-CM

## 2018-01-04 DIAGNOSIS — E785 Hyperlipidemia, unspecified: Secondary | ICD-10-CM

## 2018-01-04 MED ORDER — IBUPROFEN 600 MG PO TABS: 600.0000 mg | ORAL_TABLET | Freq: Three times a day (TID) | ORAL | 2 refills | Status: DC | PRN
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
## 2018-01-04 NOTE — Patient Instructions (Addendum)
You were seen in clinic today for chronic lower back pain.  I am ordering x-rays for further evaluation and would like you to go to [facility], Rad[facility] department.  You do not need an appointment for this.  I will let you know once we have the results of the imaging.  In the meantime,take ibuprofen 600 mg and use heating pads as this will help ease some of the pain. Additionally, some light stretching may help with your symptoms.   Please call clinic if you have any questions.  Be well, Freddrick MarchYashika Dori Devino, MD   Back Pain, Adult Back pain is very common. The pain often gets better over time. The cause of back pain is usually not dangerous. Most people can learn to manage their back pain on their own. Follow these instructions at home: Watch your back pain for any changes. The following actions may help to lessen any pain you are feeling:  Stay active. Start with short walks on flat ground if you can. Try to walk farther each day.  Exercise regularly as told by your doctor. Exercise helps your back heal faster. It also helps avoid future injury by keeping your muscles strong and flexible.  Do not sit, drive, or stand in one place for more than 30 minutes.  Do not stay in bed. Resting more than 1-2 days can slow down your recovery.  Be careful when you bend or lift an object. Use good form when lifting: ? Bend at your knees. ? Keep the object close to your body. ? Do not twist.  Sleep on a firm mattress. Lie on your side, and bend your knees. If you lie on your back, put a pillow under your knees.  Take medicines only as told by your doctor.  Put ice on the injured area. ? Put ice in a plastic bag. ? Place a towel between your skin and the bag. ? Leave the ice on for 20 minutes, 2-3 times a day for the first 2-3 days. After that, you can switch between ice and heat packs.  Avoid feeling anxious or stressed. Find good ways to deal with stress, such as  exercise.  Maintain a healthy weight. Extra weight puts stress on your back.  Contact a doctor if:  You have pain that does not go away with rest or medicine.  You have worsening pain that goes down into your legs or buttocks.  You have pain that does not get better in one week.  You have pain at night.  You lose weight.  You have a fever or chills. Get help right away if:  You cannot control when you poop (bowel movement) or pee (urinate).  Your arms or legs feel weak.  Your arms or legs lose feeling (numbness).  You feel sick to your stomach (nauseous) or throw up (vomit).  You have belly (abdominal) pain.  You feel like you may pass out (faint). This information is not intended to replace advice given to you by your health care provider. Make sure you discuss any questions you have with your health care provider. Document Released: 05/10/2008 Document Revised: 04/29/2016 Document Reviewed: 03/26/2014 Elsevier Interactive Patient Education  [employer]2018 Elsevier Inc.

## 2018-01-09 ENCOUNTER — Other Ambulatory Visit: Payer: BLUE CROSS/BLUE SHIELD

## 2018-01-09 DIAGNOSIS — E785 Hyperlipidemia, unspecified: Secondary | ICD-10-CM

## 2018-01-09 DIAGNOSIS — Z0000 Encounter for general adult medical examination without abnormal findings: Secondary | ICD-10-CM

## 2018-01-10 LAB — BASIC METABOLIC PANEL
BUN / CREAT RATIO: 19 (ref 9–23)
BUN: 12 mg/dL (ref 6–24)
CHLORIDE: 107 mmol/L — AB (ref 96–106)
CO2: 22 mmol/L (ref 20–29)
Calcium: 8.9 mg/dL (ref 8.7–10.2)
Creatinine, Ser: 0.62 mg/dL (ref 0.57–1.00)
GFR calc non Af Amer: 112 mL/min/{1.73_m2} (ref >59)
GFR, EST AFRICAN AMERICAN: 130 mL/min/{1.73_m2} (ref >59)
GLUCOSE: 89 mg/dL (ref 65–99)
Potassium: 4.3 mmol/L (ref 3.5–5.2)
Sodium: 138 mmol/L (ref 134–144)

## 2018-01-10 LAB — LIPID PANEL
Chol/HDL Ratio: 5.6 ratio — ABNORMAL HIGH (ref 0.0–4.4)
Cholesterol, Total: 201 mg/dL — ABNORMAL HIGH (ref 100–199)
HDL: 36 mg/dL — AB (ref >39)
LDL Calculated: 128 mg/dL — ABNORMAL HIGH (ref 0–99)
Triglycerides: 187 mg/dL — ABNORMAL HIGH (ref 0–149)
VLDL Cholesterol Cal: 37 mg/dL (ref 5–40)

## 2018-01-11 ENCOUNTER — Encounter: Payer: Self-pay | Admitting: Family Medicine

## 2018-01-11 NOTE — Assessment & Plan Note (Signed)
Lumbar x-ray to evaluate for degenerative change or early arthritis especially since worse with colder weather. Unlikely sciatica given no radicular symptoms and negative straight leg test.  No red flags on exam.   -Refill Ibuprofen 800 mg for prn use -Continue anti-inflammatories, heating pads and light stretching -Provided handout including back exercises - Follow-up in 4-6 weeks

## 2018-01-25 ENCOUNTER — Telehealth: Payer: Self-pay | Admitting: Family Medicine

## 2018-01-25 NOTE — Telephone Encounter (Signed)
Pt calling about her results from her lab work on 2/4. She said she normally gets a call and a letter in the mail within a week, but hasn't heard anything about it. Please call her at her home number to discuss her results.

## 2018-02-01 ENCOUNTER — Ambulatory Visit (INDEPENDENT_AMBULATORY_CARE_PROVIDER_SITE_OTHER): Payer: BLUE CROSS/BLUE SHIELD | Admitting: Family Medicine

## 2018-02-01 ENCOUNTER — Encounter: Payer: Self-pay | Admitting: Family Medicine

## 2018-02-01 ENCOUNTER — Other Ambulatory Visit: Payer: Self-pay

## 2018-02-01 VITALS — BP 116/72 | HR 75 | Temp 98.6°F | Ht 64.0 in | Wt 163.4 lb

## 2018-02-01 DIAGNOSIS — J069 Acute upper respiratory infection, unspecified: Secondary | ICD-10-CM | POA: Diagnosis not present

## 2018-02-01 DIAGNOSIS — H6122 Impacted cerumen, left ear: Secondary | ICD-10-CM

## 2018-02-01 DIAGNOSIS — J029 Acute pharyngitis, unspecified: Secondary | ICD-10-CM

## 2018-02-01 DIAGNOSIS — B9789 Other viral agents as the cause of diseases classified elsewhere: Secondary | ICD-10-CM | POA: Diagnosis not present

## 2018-02-01 LAB — POCT RAPID STREP A (OFFICE): Rapid Strep A Screen: NEGATIVE

## 2018-02-01 MED ORDER — HYDROCODONE-HOMATROPINE 5-1.5 MG/5ML PO SYRP: 5.0000 mL | ORAL_SOLUTION | Freq: Four times a day (QID) | ORAL | 0 refills | Status: DC | PRN

## 2018-02-01 MED ORDER — CARBAMIDE PEROXIDE 6.5 % OT SOLN: 5.0000 [drp] | Freq: Two times a day (BID) | OTIC | 0 refills | Status: DC

## 2018-02-01 NOTE — Patient Instructions (Addendum)
Thank you for coming in today, it was so nice to see you! Today we talked about:    Cough: This is likely from a viral upper respiratory infection. Your throat does not have any bacteria in it. Sometimes a cough can take a couple months to go away. You can take the cough syrup as prescribed to help.   Please come back if you are having any chest pain, trouble breathing, wheezing, or a temperature of 100.4 or greater  You have a large amount of wax in your ear, the fluid used today could loosen it up. Continue to use the ear drops and DO NOT USE QTIPS. I have placed a referral for you to see the ear, nose and throat doctor to get this out. Someone should call you within the next week to get this scheduled   If you have any questions or concerns, please do not hesitate to call the office at [phone]. You can also message me directly via MyChart.   Sincerely,  Anders Simmondshristina Davielle Lingelbach, MD    Upper Respiratory Infection, Adult Most upper respiratory infections (URIs) are caused by a virus. A URI affects the nose, throat, and upper air passages. The most common type of URI is often called "the common cold." Follow these instructions at home:  Take medicines only as told by your doctor.  Gargle warm saltwater or take cough drops to comfort your throat as told by your doctor.  Use a warm mist humidifier or inhale steam from a shower to increase air moisture. This may make it easier to breathe.  Drink enough fluid to keep your pee (urine) clear or pale yellow.  Eat soups and other clear broths.  Have a healthy diet.  Rest as needed.  Go back to work when your fever is gone or your doctor says it is okay. ? You may need to stay home longer to avoid giving your URI to others. ? You can also wear a face mask and wash your hands often to prevent spread of the virus.  Use your inhaler more if you have asthma.  Do not use any tobacco products, including cigarettes, chewing tobacco, or  electronic cigarettes. If you need help quitting, ask your doctor. Contact a doctor if:  You are getting worse, not better.  Your symptoms are not helped by medicine.  You have chills.  You are getting more short of breath.  You have brown or red mucus.  You have yellow or brown discharge from your nose.  You have pain in your face, especially when you bend forward.  You have a fever.  You have puffy (swollen) neck glands.  You have pain while swallowing.  You have white areas in the back of your throat. Get help right away if:  You have very bad or constant: ? Headache. ? Ear pain. ? Pain in your forehead, behind your eyes, and over your cheekbones (sinus pain). ? Chest pain.  You have long-lasting (chronic) lung disease and any of the following: ? Wheezing. ? Long-lasting cough. ? Coughing up blood. ? A change in your usual mucus.  You have a stiff neck.  You have changes in your: ? Vision. ? Hearing. ? Thinking. ? Mood. This information is not intended to replace advice given to you by your health care provider. Make sure you discuss any questions you have with your health care provider. Document Released: 05/10/2008 Document Revised: 07/25/2016 Document Reviewed: 02/27/2014 Elsevier Interactive Patient Education  2018 [employer]

## 2018-02-01 NOTE — Telephone Encounter (Signed)
Called pt to discuss results of her bloodwork, she was appreciative of the call.

## 2018-02-01 NOTE — Progress Notes (Signed)
Subjective:    Patient ID: Laura Velez , female   DOB: 30-May-1976 , 42 y.o..   MRN: [id number]  HPI  Laura Velez is here for   1. COUGH  Onset: 1 month ago after she had a fever and runny nose.  Course The fever and runny nose went away after 1 week when they first started a month ago but over the last couple days her symptoms have returned Worse with: cannot identify anything Better with: cannot identify anything  Symptoms Sputum:no  Fever: yes, subjective  Shortness of breath:no  Leg Swelling:no  Heart Burn or Reflux:no  Wheezing:no  Post Nasal Drip: yes  Positive sore throat Her ears feel "full"  Red Flags Weight Loss:  no Immunocompromised:  no  PMH Asthma or COPD: no  PMH of Smoking: no  Using ACEIs: no    Review of Systems: Per HPI.   Past Medical History: Patient Active Problem List   Diagnosis Date Noted  . Anxiety state 10/01/2015  . Low back pain 03/14/2014  . Hyperlipidemia 03/14/2014  . Malpositioned IUD 11/19/2011  . POISON IVY DERMATITIS 04/07/2010    Medications: reviewed and updated Current Outpatient Medications  Medication Sig Dispense Refill  . azithromycin (ZITHROMAX Z-PAK) 250 MG tablet Take as directed on pack 6 tablet 0  . carbamide peroxide (DEBROX) 6.5 % OTIC solution Place 5 drops into both ears 2 (two) times daily. 15 mL 0  . citalopram (CELEXA) 40 MG tablet Take 1 tablet (40 mg total) by mouth daily. (Patient not taking: Reported on 11/17/2016) 30 tablet 2  . HYDROcodone-homatropine (HYCODAN) 5-1.5 MG/5ML syrup Take 5 mLs by mouth every 6 (six) hours as needed for cough. 120 mL 0  . ibuprofen (ADVIL,MOTRIN) 600 MG tablet Take 1 tablet (600 mg total) by mouth every 8 (eight) hours as needed. 30 tablet 2   No current facility-administered medications for this visit.     Social Hx:  reports that  has never smoked. she has never used smokeless tobacco.   Objective:   BP 116/72   Pulse 75   Temp 98.6 F (37 C) (Oral)   Ht 5\' 4"   (1.626 m)   Wt 163 lb 6.4 oz (74.1 kg)   SpO2 99%   BMI 28.05 kg/m  Physical Exam  Gen: NAD, alert, cooperative with exam, well-appearing HEENT:     Head: Normocephalic, atraumatic    Neck: No masses palpated. No goiter. No lymphadenopathy     Ears: External ears normal, no drainage.Tympanic membranes intact without bulging on the right. Cerumen impaction on the left, unable to visualize TM    Eyes: PERRLA, EOMI, sclera white, normal conjunctiva    Nose: nasal turbinates moist, no nasal discharge    Throat: moist mucus membranes, slight pharyngeal erythema with slightly enlarged tonsils, no tonsillar exudate. Airway is patent Cardiac: Regular rate and rhythm, normal S1/S2, no murmur, no edema, capillary refill brisk  Respiratory: Clear to auscultation bilaterally, no wheezes, non-labored breathing Skin: no rashes, normal turgor  Neurological: no gross deficits.  Psych: good insight, normal mood and affect  Results for orders placed or performed in visit on 02/01/18  Rapid Strep A  Result Value Ref Range   Rapid Strep A Screen Negative Negative    Assessment & Plan:   1. Sore throat: Rapid Strep A negative - Symptomatic care: warm tea with honey, Ibuprofen PRN  2. Impacted cerumen of left ear: Multiple attempts at ear lavage without success. Cerumen seems very impacted and  too far back in canal to be removed manually in office. Likely worsened by daily QTIP use. She would benefit from seeing ENT for removal - Ambulatory referral to ENT - Daily Debrox  3. Acute upper respiratory infection with cough: Patient afebrile here and vitals normal. Physical exam showing pharyngeal erythema. The plan is as follows -Symptomatic therapy suggested: push fluids, rest, use acetaminophen/ibuprofen prn  -Return precautions discussed, return office visit if symptoms persist or worsen.  -Lack of antibiotic effectiveness discussed  -Hydocan q 6 PRN for cough  Orders Placed This Encounter    Procedures  . Ambulatory referral to ENT    Referral Priority:   Routine    Referral Type:   Consultation    Referral Reason:   Specialty Services Required    Requested Specialty:   Otolaryngology    Number of Visits Requested:   1  . Rapid Strep A   Meds ordered this encounter  Medications  . HYDROcodone-homatropine (HYCODAN) 5-1.5 MG/5ML syrup    Sig: Take 5 mLs by mouth every 6 (six) hours as needed for cough.    Dispense:  120 mL    Refill:  0  . carbamide peroxide (DEBROX) 6.5 % OTIC solution    Sig: Place 5 drops into both ears 2 (two) times daily.    Dispense:  15 mL    Refill:  0    Estell Puccini, MD [facility], PGY-3

## 2018-03-14 ENCOUNTER — Encounter (HOSPITAL_COMMUNITY): Payer: Self-pay | Admitting: Emergency Medicine

## 2018-03-14 ENCOUNTER — Ambulatory Visit (HOSPITAL_COMMUNITY)
Admission: EM | Admit: 2018-03-14 | Discharge: 2018-03-14 | Disposition: A | Discharge status: Home / Self Care | Payer: BLUE CROSS/BLUE SHIELD | Attending: Family Medicine | Admitting: Family Medicine

## 2018-03-14 DIAGNOSIS — L237 Allergic contact dermatitis due to plants, except food: Secondary | ICD-10-CM

## 2018-03-14 DIAGNOSIS — R21 Rash and other nonspecific skin eruption: Secondary | ICD-10-CM

## 2018-03-14 MED ORDER — METHYLPREDNISOLONE SODIUM SUCC 125 MG IJ SOLR
INTRAMUSCULAR | Status: AC
  Filled 2018-03-14: qty 2

## 2018-03-14 MED ORDER — METHYLPREDNISOLONE SODIUM SUCC 125 MG IJ SOLR
125.0000 mg | Freq: Once | INTRAMUSCULAR | Status: AC
  Administered 2018-03-14: 125 mg via INTRAMUSCULAR

## 2018-03-14 MED ORDER — PREDNISONE 10 MG (21) PO TBPK: ORAL_TABLET | Freq: Every day | ORAL | 0 refills | Status: AC

## 2018-03-14 NOTE — ED Provider Notes (Signed)
[facility]    CSN: [id number] Arrival date & time: 03/14/18  1906     History   Chief Complaint Chief Complaint  Patient presents with  . Poison Ivy    HPI Laura Velez is a 42 y.o. female.   42 year old female comes in for 4-day history of rash after doing yard work.  States area is itching, and spreading to the face after touching her face.  Denies pain, spreading erythema, increased warmth.  Denies fever, chills, night sweats.  Has been applying over-the-counter cortisone cream without improvement.     Past Medical History:  Diagnosis Date  . Medical history non-contributory   . RENAL CALCULUS, HX OF 11/24/2007    Patient Active Problem List   Diagnosis Date Noted  . Anxiety state 10/01/2015  . Low back pain 03/14/2014  . Hyperlipidemia 03/14/2014  . Malpositioned IUD 11/19/2011  . 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 NO PAST SURGERIES      OB History    Gravida  5   Para  3   Term  3   Preterm      AB  1   Living  3     SAB  1   TAB      Ectopic      Multiple      Live Births  3            Home Medications    Prior to Admission medications   Medication Sig Start Date End Date Taking? Authorizing Provider  predniSONE (STERAPRED UNI-PAK 21 TAB) 10 MG (21) TBPK tablet Take by mouth daily. Take 6 tabs by mouth day 1, then 5 tabs, then 4 tabs, then 3 tabs, 2 tabs, then 1 tab for the last day 03/14/18   Belinda Fisher, PA-C    Family History Family History  Problem Relation Age of Onset  . Kidney Stones Mother   . Heart disease Father     Social History Social History   Tobacco Use  . Smoking status: Never Smoker  . Smokeless tobacco: Never Used  Substance Use Topics  . Alcohol use: No  . Drug use: No     Allergies   Patient has no known allergies.   Review of Systems Review of Systems  Reason unable to perform ROS: See HPI as above.     Physical  Exam Triage Vital Signs ED Triage Vitals [03/14/18 2006]  Enc Vitals Group     BP (!) 128/95     Pulse Rate 70     Resp 16     Temp 98.6 F (37 C)     Temp Source Oral     SpO2 99 %     Weight      Height      Head Circumference      Peak Flow      Pain Score      Pain Loc      Pain Edu?      Excl. in GC?    No data found.  Updated Vital Signs BP (!) 128/95 (BP Location: Right Arm)   Pulse 70   Temp 98.6 F (37 C) (Oral)   Resp 16   SpO2 99%    Physical Exam  Constitutional: She is oriented to person, place, and time. She appears well-developed and well-nourished. No distress.  HENT:  Head: Normocephalic and atraumatic.  Eyes: Pupils are equal, round,  and reactive to light. Conjunctivae are normal.  Neurological: She is alert and oriented to person, place, and time.  Skin: Skin is warm and dry.  See picture below.  Surrounding erythema without increased warmth.  No tenderness to palpation.         UC Treatments / Results  Labs (all labs ordered are listed, but only abnormal results are displayed) Labs Reviewed - No data to display  EKG None Radiology No results found.  Procedures Procedures (including critical care time)  Medications Ordered in UC Medications  methylPREDNISolone sodium succinate (SOLU-MEDROL) 125 mg/2 mL injection 125 mg (125 mg Intramuscular Given 03/14/18 2039)     Initial Impression / Assessment and Plan / UC Course  I have reviewed the triage vital signs and the nursing notes.  Pertinent labs & imaging results that were available during my care of the patient were reviewed by me and considered in my medical decision making (see chart for details).    Rest consistent with poison ivy feet dermatitis.  Solu-Medrol injection in office today.  Prednisone as directed.  OTC antihistamines to help with pruritus.  Return precautions given.  Patient expresses understanding and agrees to plan.  Final Clinical Impressions(s) / UC  Diagnoses   Final diagnoses:  Rash  Poison ivy dermatitis    ED Discharge Orders        Ordered    predniSONE (STERAPRED UNI-PAK 21 TAB) 10 MG (21) TBPK tablet  Daily     03/14/18 2034        Belinda FisherYu, Amy V, PA-C 03/14/18 2100

## 2018-03-14 NOTE — ED Triage Notes (Signed)
Patient c/o poison ivy onset last Friday. She reports she was working in the yard on Thursday and rash began on Friday. Patient reports it is all over both upper extremities and face and neck. She has tried otc cortisone creams and benadryl with no relief.

## 2018-03-14 NOTE — Discharge Instructions (Signed)
Solu-Medrol injection in office today.  Prednisone pack as directed.  Over-the-counter allergy medicine Claritin such as Zyrtec, Allegra, to help with itchiness.  Monitor for spreading redness, increased warmth, fever, pain, follow-up for reevaluation.

## 2018-03-21 ENCOUNTER — Other Ambulatory Visit (HOSPITAL_COMMUNITY)
Admission: RE | Admit: 2018-03-21 | Discharge: 2018-03-21 | Disposition: A | Discharge status: Home / Self Care | Payer: BLUE CROSS/BLUE SHIELD | Source: Ambulatory Visit | Attending: Obstetrics and Gynecology | Admitting: Obstetrics and Gynecology

## 2018-03-21 ENCOUNTER — Other Ambulatory Visit: Payer: Self-pay | Admitting: Obstetrics and Gynecology

## 2018-03-21 DIAGNOSIS — Z01419 Encounter for gynecological examination (general) (routine) without abnormal findings: Secondary | ICD-10-CM | POA: Diagnosis present

## 2018-03-24 LAB — CYTOLOGY - PAP
Diagnosis: NEGATIVE
HPV: NOT DETECTED

## 2018-09-13 ENCOUNTER — Ambulatory Visit (INDEPENDENT_AMBULATORY_CARE_PROVIDER_SITE_OTHER): Payer: BLUE CROSS/BLUE SHIELD

## 2018-09-13 DIAGNOSIS — Z23 Encounter for immunization: Secondary | ICD-10-CM

## 2019-02-25 IMAGING — DX DG LUMBAR SPINE COMPLETE 4+V
5 series · 5 of 5 positions shown · non-contrast
Comparison: 08/28/2014.

CLINICAL DATA: Back pain.

EXAM:
LUMBAR SPINE - COMPLETE 4+ VIEW

[l-spine ap]
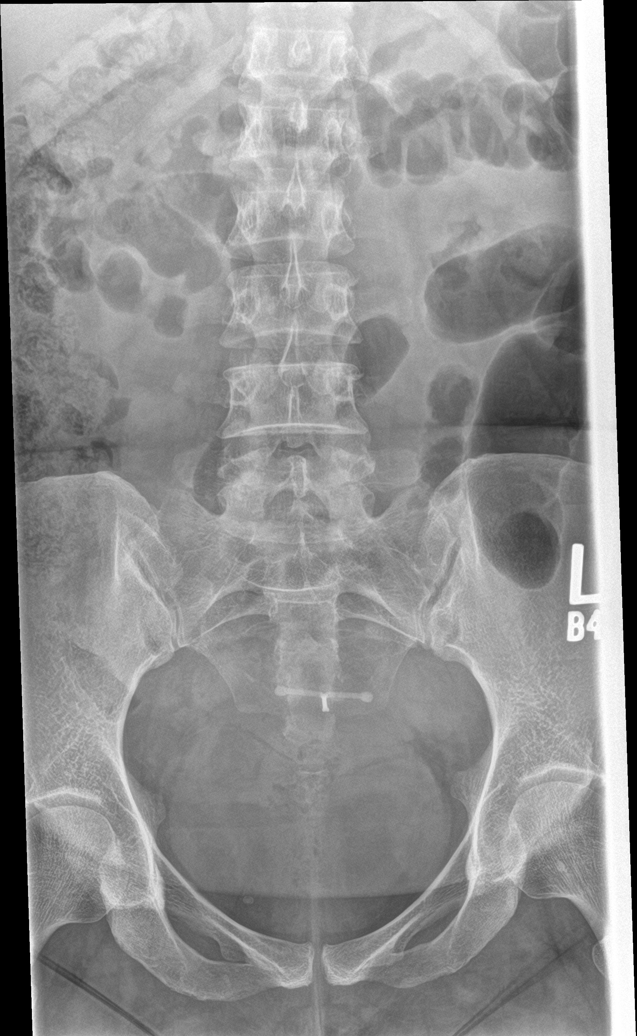

[l-spine obl (1 of 2)]
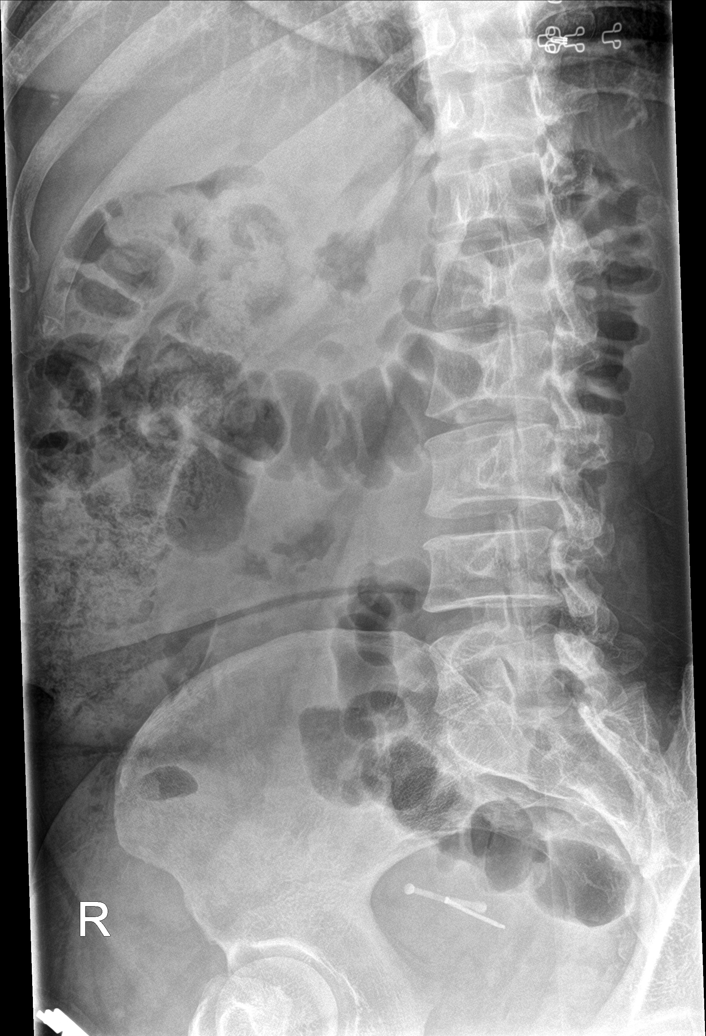

[l-spine obl (2 of 2)]
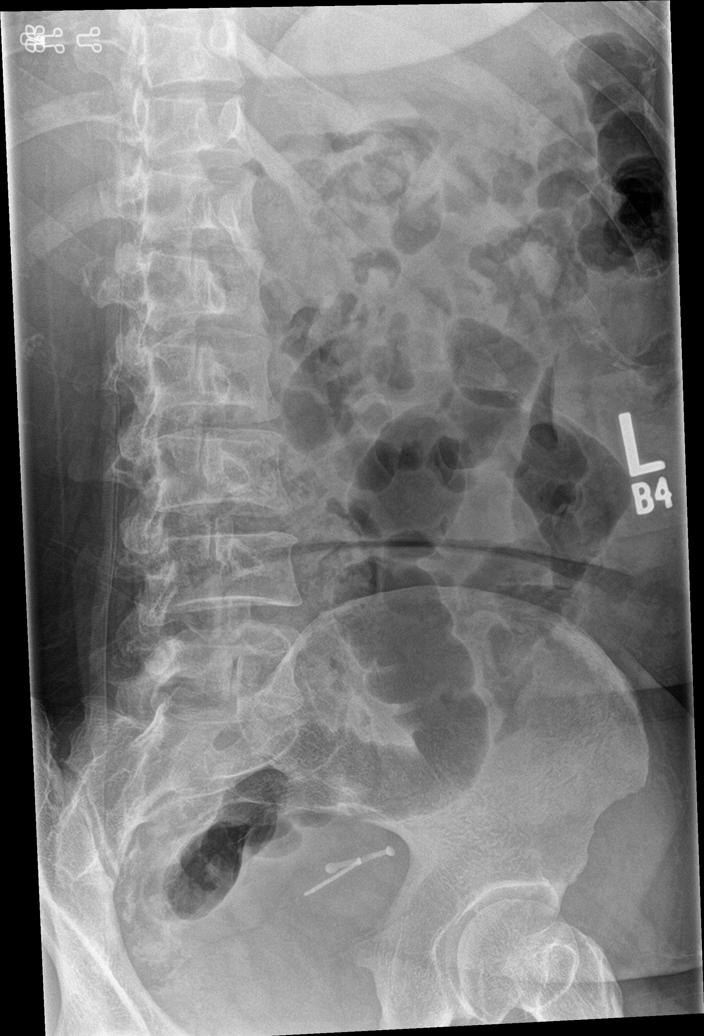

[l-spine lat]
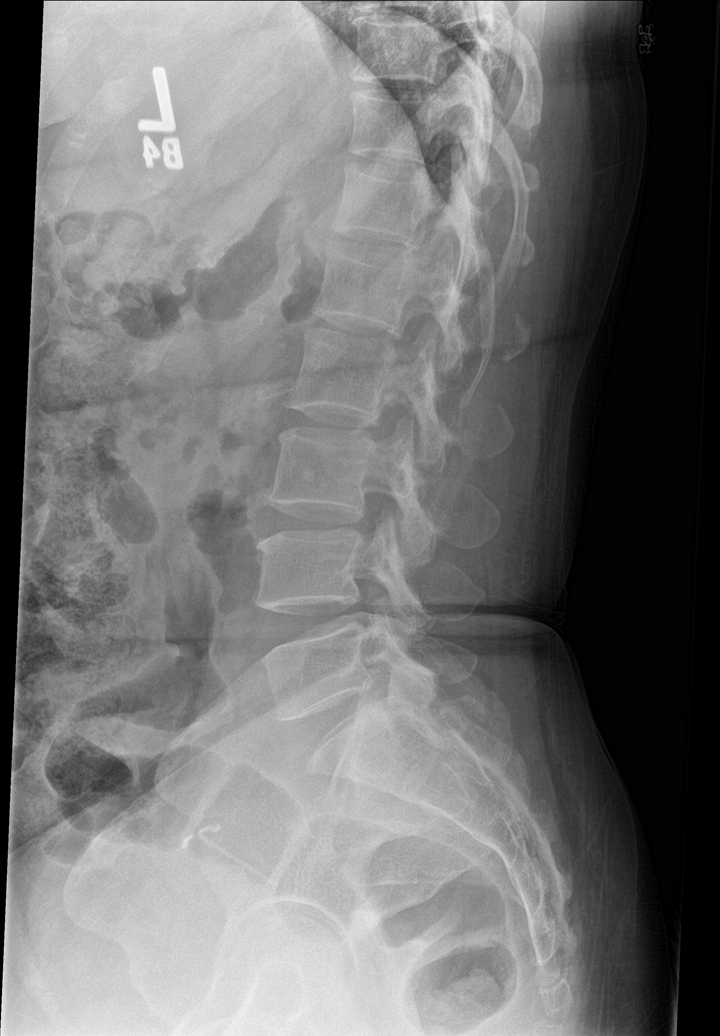

[l-spine spot]
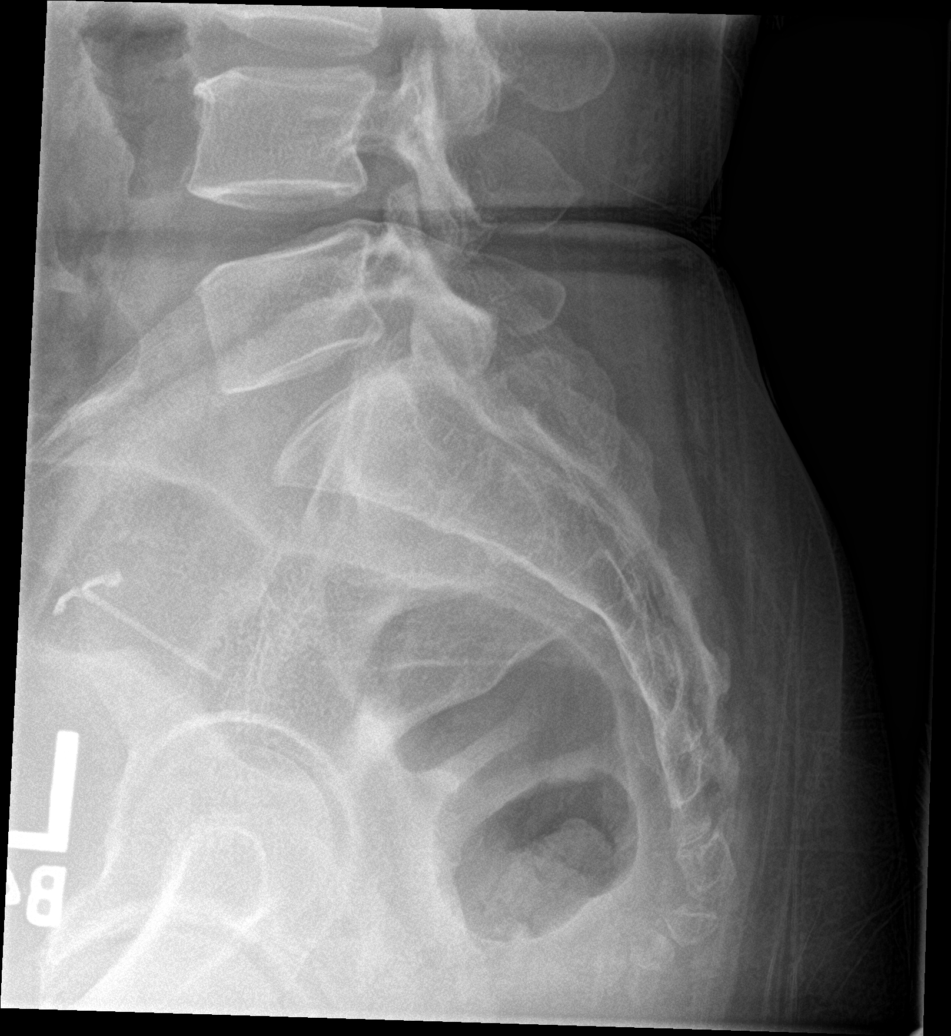

[5 of 5 positions shown; findings below may reference images not displayed]

FINDINGS: Diffuse mild degenerative change lumbar spine. No acute bony
abnormality identified. IUD noted in the pelvis. Pelvic
calcification consistent phlebolith.
IMPRESSION: Diffuse mild degenerative change. No acute abnormality. Exam stable
from prior exam.

## 2019-10-05 ENCOUNTER — Ambulatory Visit: Payer: BLUE CROSS/BLUE SHIELD

## 2019-12-06 ENCOUNTER — Ambulatory Visit: Payer: BLUE CROSS/BLUE SHIELD | Attending: Internal Medicine

## 2019-12-10 ENCOUNTER — Ambulatory Visit: Payer: BLUE CROSS/BLUE SHIELD | Attending: Internal Medicine

## 2019-12-10 DIAGNOSIS — Z20822 Contact with and (suspected) exposure to covid-19: Secondary | ICD-10-CM

## 2019-12-11 ENCOUNTER — Telehealth: Payer: Self-pay

## 2019-12-11 LAB — NOVEL CORONAVIRUS, NAA: SARS-CoV-2, NAA: DETECTED — AB

## 2019-12-11 NOTE — Telephone Encounter (Signed)
Pt notified of positive COVID-19 test results. Pt verbalized understanding. Pt reports that she has body aches and chills.Pt advised to remain in self quarantine until at least 10 days since symptom onset And 3 consecutive days fever free without antipyretics And improvement in respiratory symptoms. Patient advised to utilize over the counter medications to treat symptoms. Pt advised to seek treatment in the ED if respiratory issues/distress develops.Pt advised they should only leave home to seek and medical care and must wear a mask in public. Pt instructed to limit contact with family members or caregivers in the home. Pt advised to practice social distancing and to continue to use good preventative care measures such has frequent hand washing, staying out of crowds and cleaning hard surfaces frequently touched in the home.Pt informed that the health department will likely follow up and may have additional recommendations.

## 2021-05-15 ENCOUNTER — Ambulatory Visit (HOSPITAL_COMMUNITY)
Admission: EM | Admit: 2021-05-15 | Discharge: 2021-05-15 | Disposition: A | Discharge status: Home / Self Care | Payer: BLUE CROSS/BLUE SHIELD | Attending: Emergency Medicine | Admitting: Emergency Medicine

## 2021-05-15 ENCOUNTER — Encounter (HOSPITAL_COMMUNITY): Payer: Self-pay

## 2021-05-15 ENCOUNTER — Other Ambulatory Visit: Payer: Self-pay

## 2021-05-15 DIAGNOSIS — J069 Acute upper respiratory infection, unspecified: Secondary | ICD-10-CM | POA: Diagnosis not present

## 2021-05-15 DIAGNOSIS — R059 Cough, unspecified: Secondary | ICD-10-CM | POA: Diagnosis present

## 2021-05-15 DIAGNOSIS — U071 COVID-19: Secondary | ICD-10-CM | POA: Diagnosis not present

## 2021-05-15 LAB — SARS CORONAVIRUS 2 (TAT 6-24 HRS): SARS Coronavirus 2: POSITIVE — AB

## 2021-05-15 MED ORDER — FLUTICASONE PROPIONATE 50 MCG/ACT NA SUSP: 2.0000 | Freq: Every day | NASAL | 0 refills | Status: AC

## 2021-05-15 MED ORDER — PROMETHAZINE-DM 6.25-15 MG/5ML PO SYRP: 5.0000 mL | ORAL_SOLUTION | Freq: Four times a day (QID) | ORAL | 0 refills | Status: AC | PRN

## 2021-05-15 NOTE — ED Triage Notes (Signed)
Pt reports positive home covid test yesterday. Pt reports had some symptoms 3 weeks ago which went away and came back Sunday. Pt having itchy throat, cough, runny nose, sneezing and left ear discomfort/fullness.  Has taken tylenol and dayquil for symptoms.

## 2021-05-15 NOTE — Discharge Instructions (Signed)
We will contact you if your COVID test is positive.  Please quarantine while waiting on results.  If your test is positive, please continue to quarantine for at least 5 days from symptom start or until you do not have a fever for at least 24 hours without the use of medication.    You can take Tylenol and/or Ibuprofen as needed for fever reduction and pain relief.  Use the Flonase daily to help with congestion.  Take the PromethazineDM for cough.  It can make you sleepy so do not take it prior to driving.    For cough: honey 1/2 to 1 teaspoon (you can dilute the honey in water or another fluid).  You can also use guaifenesin and dextromethorphan for cough. You can use a humidifier for chest congestion and cough.  If you don't have a humidifier, you can sit in the bathroom with the hot shower running.     For sore throat: try warm salt water gargles, cepacol lozenges, throat spray, warm tea or water with lemon/honey, popsicles or ice, or OTC cold relief medicine for throat discomfort.    For congestion: take a daily anti-histamine like Zyrtec, Claritin, and a oral decongestant, such as pseudoephedrine.  You can also use Flonase 1-2 sprays in each nostril daily.  You can also use a nasal saline spray or neti pot.     It is important to stay hydrated: drink plenty of fluids (water, gatorade/powerade/pedialyte, juices, or teas) to keep your throat moisturized and help further relieve irritation/discomfort.   Return or go to the Emergency Department if symptoms worsen or do not improve in the next few days.  

## 2021-05-15 NOTE — ED Provider Notes (Signed)
[facility]    CSN: [id number] Arrival date & time: 05/15/21  1443      History   Chief Complaint Chief Complaint  Patient presents with   Ear Fullness   itchy throat   Cough    HPI Laura Velez is a 45 y.o. female.   Patient here for evaluation of cough, sore throat, nasal congestion, and  ear pain that started Sunday.  Reports having similar symptoms about 3 weeks ago but states that symptoms resolved and then returned.  Reports taking an at-home COVID test that was positive.  Has been taking Tylenol and Dayquil for symptom relief.  Denies any fevers, chest pain, shortness of breath, N/V/D, numbness, tingling, weakness, abdominal pain, or headaches.    The history is provided by the patient.  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 Authorizing Provider  fluticasone (FLONASE) 50 MCG/ACT nasal spray Place 2 sprays into both nostrils daily. 05/15/21  Yes Ivette Loyal, NP  promethazine-dextromethorphan (PROMETHAZINE-DM) 6.25-15 MG/5ML syrup Take 5 mLs by mouth 4 (four) times daily as needed for cough. 05/15/21  Yes Ivette Loyal, NP  UNABLE TO FIND Med Name: Pt unsure of medication, but states she is taking meds for cholesterol   Yes [provider]  predniSONE (STERAPRED UNI-PAK 21 TAB) 10 MG (21) TBPK tablet Take by mouth daily. Take 6 tabs by mouth day 1, then 5 tabs, then 4 tabs, then 3 tabs, 2 tabs, then 1 tab for the last day 03/14/18   Belinda Fisher, PA-C    Family History Family History  Problem Relation Age of Onset   Kidney Stones Mother    Heart disease Father     Social History Social History   Tobacco Use   Smoking status: Never   Smokeless tobacco: Never  Vaping Use   Vaping Use: Never used  Substance Use Topics   Alcohol use: No   Drug use: No     Allergies   Patient has no known allergies.   Review of Systems Review of Systems  Constitutional:  Positive for fatigue.  HENT:  Positive for congestion, ear pain and sore throat.   Respiratory:  Positive for cough.   Musculoskeletal:  Positive for myalgias.  All other systems reviewed and are negative.   Physical Exam Triage Vital Signs ED Triage Vitals  Enc Vitals Group     BP 05/15/21 1053 124/83  Pulse Rate 05/15/21 1053 75     Resp 05/15/21 1053 18     Temp 05/15/21 1053 98.8 F (37.1 C)     Temp src --      SpO2 05/15/21 1053 96 %     Weight --      Height --      Head Circumference --      Peak Flow --      Pain Score 05/15/21 1050 0     Pain Loc --      Pain Edu? --      Excl. in GC? --    No data found.  Updated Vital Signs BP 124/83   Pulse 75   Temp 98.8 F (37.1 C)   Resp 18   SpO2 96%   Visual Acuity Right Eye Distance:   Left Eye Distance:   Bilateral Distance:    Right Eye Near:   Left Eye Near:    Bilateral Near:     Physical Exam Vitals and nursing note reviewed.  Constitutional:      General: She is not in acute distress.    Appearance: Normal appearance. She is not ill-appearing, toxic-appearing or diaphoretic.  HENT:     Head: Normocephalic and atraumatic.     Right Ear: Tympanic membrane, ear canal and external ear normal.     Left Ear: Tympanic membrane, ear canal and external ear normal.      Nose: Congestion and rhinorrhea present. Rhinorrhea is clear.     Mouth/Throat:     Pharynx: Pharyngeal swelling and posterior oropharyngeal erythema present. No oropharyngeal exudate or uvula swelling.     Tonsils: No tonsillar exudate or tonsillar abscesses. 1+ on the right. 1+ on the left.  Eyes:     Conjunctiva/sclera: Conjunctivae normal.  Cardiovascular:     Rate and Rhythm: Normal rate.     Pulses: Normal pulses.  Pulmonary:     Effort: Pulmonary effort is normal.  Abdominal:     General: Abdomen is flat.  Musculoskeletal:        General: Normal range of motion.     Cervical back: Normal range of motion.  Skin:    General: Skin is warm and dry.  Neurological:     General: No focal deficit present.     Mental Status: She is alert and oriented to person, place, and time.  Psychiatric:        Mood and Affect: Mood normal.     UC Treatments / Results  Labs (all labs ordered are listed, but only abnormal results are displayed) Labs Reviewed  SARS CORONAVIRUS 2 (TAT 6-24 HRS)    EKG   Radiology No results found.  Procedures Procedures (including critical care time)  Medications Ordered in UC Medications - No data to display  Initial Impression / Assessment and Plan / UC Course  I have reviewed the triage vital signs and the nursing notes.  Pertinent labs & imaging results that were available during my care of the patient were reviewed by me and considered in my medical decision making (see chart for details).    Assessment negative for red flags or concerns.  This is likely COVID as she had a positive at home test.  Discussed quarantining for at least 5 days from symptom start or until she has been afebrile without the use of medications for at least 24 hours.  May use Flonase 2 sprays each nostril daily for congestion.  Promethazine DM as needed for cough  at night.  Discussed conservative symptom management as described in discharge instructions.  Work note  supplied to patient.  Follow-up with primary care as needed. Final Clinical Impressions(s) / UC Diagnoses   Final diagnoses:  Viral URI with cough     Discharge Instructions      We will contact you if your COVID test is positive.  Please quarantine while waiting on results.  If your test is positive, please continue to quarantine for at least 5 days from symptom start or until you do not have a fever for at least 24 hours without the use of medication.    You can take Tylenol and/or Ibuprofen as needed for fever reduction and pain relief.  Use the Flonase daily to help with congestion.  Take the PromethazineDM for cough.  It can make you sleepy so do not take it prior to driving.    For cough: honey 1/2 to 1 teaspoon (you can dilute the honey in water or another fluid).  You can also use guaifenesin and dextromethorphan for cough. You can use a humidifier for chest congestion and cough.  If you don't have a humidifier, you can sit in the bathroom with the hot shower running.     For sore throat: try warm salt water gargles, cepacol lozenges, throat spray, warm tea or water with lemon/honey, popsicles or ice, or OTC cold relief medicine for throat discomfort.    For congestion: take a daily anti-histamine like Zyrtec, Claritin, and a oral decongestant, such as pseudoephedrine.  You can also use Flonase 1-2 sprays in each nostril daily.  You can also use a nasal saline spray or neti pot.     It is important to stay hydrated: drink plenty of fluids (water, gatorade/powerade/pedialyte, juices, or teas) to keep your throat moisturized and help further relieve irritation/discomfort.   Return or go to the Emergency Department if symptoms worsen or do not improve in the next few days.      ED Prescriptions     Medication Sig Dispense Auth. Provider   fluticasone (FLONASE) 50 MCG/ACT nasal spray Place 2 sprays into both nostrils daily. 18.2 mL Ivette Loyal, NP    promethazine-dextromethorphan (PROMETHAZINE-DM) 6.25-15 MG/5ML syrup Take 5 mLs by mouth 4 (four) times daily as needed for cough. 118 mL Ivette Loyal, NP      PDMP not reviewed this encounter.   Ivette Loyal, NP 05/15/21 [phone]

## 2021-08-18 ENCOUNTER — Other Ambulatory Visit: Payer: Self-pay

## 2021-08-18 ENCOUNTER — Encounter (HOSPITAL_BASED_OUTPATIENT_CLINIC_OR_DEPARTMENT_OTHER): Payer: Self-pay

## 2021-08-18 DIAGNOSIS — R1013 Epigastric pain: Secondary | ICD-10-CM | POA: Diagnosis not present

## 2021-08-18 DIAGNOSIS — R519 Headache, unspecified: Secondary | ICD-10-CM | POA: Diagnosis not present

## 2021-08-18 DIAGNOSIS — R079 Chest pain, unspecified: Secondary | ICD-10-CM | POA: Diagnosis not present

## 2021-08-18 DIAGNOSIS — G4700 Insomnia, unspecified: Secondary | ICD-10-CM | POA: Insufficient documentation

## 2021-08-18 NOTE — ED Triage Notes (Signed)
Patient here POV from Home with SOB, Anxiety, and Headache.  Patient states she has been having Anxiety due to her Husband passing recently and has been having Difficulty Breathing and a Headache for approximately 2 weeks.  NAD Noted during Triage. A&Ox4. Ambulatory. GCS 15.

## 2021-08-19 ENCOUNTER — Emergency Department (HOSPITAL_BASED_OUTPATIENT_CLINIC_OR_DEPARTMENT_OTHER): Payer: BLUE CROSS/BLUE SHIELD | Admitting: Radiology

## 2021-08-19 ENCOUNTER — Emergency Department (HOSPITAL_BASED_OUTPATIENT_CLINIC_OR_DEPARTMENT_OTHER)
Admission: EM | Admit: 2021-08-19 | Discharge: 2021-08-19 | Disposition: A | Discharge status: Home / Self Care | Payer: BLUE CROSS/BLUE SHIELD | Attending: Emergency Medicine | Admitting: Emergency Medicine

## 2021-08-19 DIAGNOSIS — R079 Chest pain, unspecified: Secondary | ICD-10-CM

## 2021-08-19 DIAGNOSIS — G4700 Insomnia, unspecified: Secondary | ICD-10-CM

## 2021-08-19 LAB — CBC
HCT: 41.1 % (ref 36.0–46.0)
Hemoglobin: 13.5 g/dL (ref 12.0–15.0)
MCH: 28.7 pg (ref 26.0–34.0)
MCHC: 32.8 g/dL (ref 30.0–36.0)
MCV: 87.4 fL (ref 80.0–100.0)
Platelets: 279 10*3/uL (ref 150–400)
RBC: 4.7 MIL/uL (ref 3.87–5.11)
RDW: 12.4 % (ref 11.5–15.5)
WBC: 8.4 10*3/uL (ref 4.0–10.5)
nRBC: 0 % (ref 0.0–0.2)

## 2021-08-19 LAB — COMPREHENSIVE METABOLIC PANEL
ALT: 23 U/L (ref 0–44)
AST: 14 U/L — ABNORMAL LOW (ref 15–41)
Albumin: 4.4 g/dL (ref 3.5–5.0)
Alkaline Phosphatase: 63 U/L (ref 38–126)
Anion gap: 8 (ref 5–15)
BUN: 7 mg/dL (ref 6–20)
CO2: 26 mmol/L (ref 22–32)
Calcium: 9.5 mg/dL (ref 8.9–10.3)
Chloride: 103 mmol/L (ref 98–111)
Creatinine, Ser: 0.57 mg/dL (ref 0.44–1.00)
GFR, Estimated: >60 mL/min (ref >60)
Glucose, Bld: 107 mg/dL — ABNORMAL HIGH (ref 70–99)
Potassium: 3.7 mmol/L (ref 3.5–5.1)
Sodium: 137 mmol/L (ref 135–145)
Total Bilirubin: 0.5 mg/dL (ref 0.3–1.2)
Total Protein: 7.4 g/dL (ref 6.5–8.1)

## 2021-08-19 LAB — TROPONIN I (HIGH SENSITIVITY): Troponin I (High Sensitivity): <2 ng/L (ref <18)

## 2021-08-19 MED ORDER — SUCRALFATE 1 G PO TABS: 1.0000 g | ORAL_TABLET | Freq: Four times a day (QID) | ORAL | 0 refills | Status: AC | PRN

## 2021-08-19 MED ORDER — OMEPRAZOLE 20 MG PO CPDR: 20.0000 mg | DELAYED_RELEASE_CAPSULE | Freq: Every day | ORAL | 1 refills | Status: AC

## 2021-08-19 MED ORDER — HALOPERIDOL LACTATE 5 MG/ML IJ SOLN
4.0000 mg | Freq: Once | INTRAMUSCULAR | Status: AC
  Administered 2021-08-19: 4 mg via INTRAMUSCULAR
  Filled 2021-08-19: qty 1

## 2021-08-19 NOTE — Discharge Instructions (Addendum)
You were evaluated in the Emergency Department and after careful evaluation, we did not find any emergent condition requiring admission or further testing in the hospital.  Your exam/testing today is overall reassuring.  You seem to be suffering from insomnia.  The cause is yet to be determined.  Recommend follow-up with your PCP or the neurologist for further management.  Your pain may be due to acid reflux.  Use the omeprazole medication to prevent pain, use the Carafate medication as needed for immediate relief.  Please return to the Emergency Department if you experience any worsening of your condition.   Thank you for allowing [place] to be a part of your care.

## 2021-08-19 NOTE — ED Provider Notes (Signed)
[facility] Emergency Department Provider Note MRN:  [id number]  Arrival date & time: 08/19/21     Chief Complaint   Insomnia History of Present Illness   Laura Velez is a 45 y.o. year-old female with no pertinent past medical history presenting to the ED with chief complaint of insomnia.  Patient is here with multiple complaints.  Has not been able to sleep well for the past several weeks.  Has followed up with primary care doctor and has been prescribed a number of medications.  Her husband recently passed away last month.  Described as "mind will not turn off".  Denies depression.  Not eating or drinking well.  Low energy.  Also having some intermittent chest pain and epigastric pain described as a burning discomfort.  Also endorsing dull frontal headache that she attributes to lack of sleep.  No neck pain, no shortness of breath, no lower abdominal pain, no numbness or weakness to the arms or legs.  Review of Systems  A complete 10 system review of systems was obtained and all systems are negative except as noted in the HPI and PMH.   Patient's Health History    Past Medical History:  Diagnosis Date   Medical history non-contributory    RENAL CALCULUS, HX OF 11/24/2007    Past Surgical History:  Procedure Laterality Date   NO PAST SURGERIES      Family History  Problem Relation Age of Onset   Kidney Stones Mother    Heart disease Father     Social History   Socioeconomic History   Marital status: Married    Spouse name: Not on file   Number of children: Not on file   Years of education: Not on file   Highest education level: Not on file  Occupational History   Not on file  Tobacco Use   Smoking status: Never   Smokeless tobacco: Never  Vaping Use   Vaping Use: Never used  Substance and Sexual Activity   Alcohol use: No   Drug use: No   Sexual activity: Yes  Other Topics Concern   Not on file  Social History Narrative   Not on file    Social Determinants of Health   Financial Resource Strain: Not on file  Food Insecurity: Not on file  Transportation Needs: Not on file  Physical Activity: Not on file  Stress: Not on file  Social Connections: Not on file  Intimate Partner Violence: Not on file     Physical Exam   Vitals:   08/18/21 2352 08/19/21 0338  BP: (!) 127/91 124/87  Pulse: 71 76  Resp: 12 16  Temp: 98.2 F (36.8 C)   SpO2: 100% 100%    CONSTITUTIONAL: Well-appearing, NAD NEURO:  Alert and oriented x 3, no focal deficits EYES:  eyes equal and reactive ENT/NECK:  no LAD, no JVD CARDIO: Regular rate, well-perfused, normal S1 and S2 PULM:  CTAB no wheezing or rhonchi GI/GU:  normal bowel sounds, non-distended, non-tender MSK/SPINE:  No gross deformities, no edema SKIN:  no rash, atraumatic PSYCH:  Appropriate speech and behavior  *Additional and/or pertinent findings included in MDM below  Diagnostic and Interventional Summary    EKG Interpretation  Date/Time:  Tuesday August 18 2021 23:45:58 EDT Ventricular Rate:  71 PR Interval:  144 QRS Duration: 94 QT Interval:  356 QTC Calculation: 386 R Axis:   71 Text Interpretation: Normal sinus rhythm Incomplete right bundle branch block Nonspecific T wave abnormality Abnormal  ECG Confirmed by Kennis Carina [phone]) on 08/19/2021 2:51:27 AM       Labs Reviewed  COMPREHENSIVE METABOLIC PANEL - Abnormal; Notable for the following components:      Result Value   Glucose, Bld 107 (*)    AST 14 (*)    All other components within normal limits  CBC  TROPONIN I (HIGH SENSITIVITY)  TROPONIN I (HIGH SENSITIVITY)    DG Chest 2 View  Final Result      Medications  haloperidol lactate (HALDOL) injection 4 mg (4 mg Intramuscular Given 08/19/21 0335)     Procedures  /  Critical Care Procedures  ED Course and Medical Decision Making  I have reviewed the triage vital signs, the nursing notes, and pertinent available records from the  EMR.  Listed above are laboratory and imaging tests that I personally ordered, reviewed, and interpreted and then considered in my medical decision making (see below for details).  Suspect bereavement or depression causing lack of sleep, poor appetite.  Given the chest pain will need EKG and troponin to exclude ACS, less likely Takotsubo's.  EKG is reassuring.  Patient has tried Ambien, hydroxyzine, has been started on Zoloft, and has tried a single dose of clonazepam for sleep and they are not helping.  Trying to explain that insomnia is not an emergent condition and she will need further follow-up.     Work-up reassuring, troponin negative, headache much improved after Haldol, seems more restful.  Appropriate for discharge  Elmer Sow. Pilar Plate, MD [facility] Emergency Medicine [facility] [email or web address]  Final Clinical Impressions(s) / ED Diagnoses     ICD-10-CM   1. Chest pain, unspecified type  R07.9     2. Insomnia, unspecified type  G47.00       ED Discharge Orders          Ordered    Ambulatory referral to Neurology       Comments: An appointment is requested in approximately: 1 week   08/19/21 0440    omeprazole (PRILOSEC) 20 MG capsule  Daily        08/19/21 0441    sucralfate (CARAFATE) 1 g tablet  4 times daily PRN        08/19/21 0441             Discharge Instructions Discussed with and Provided to Patient:     Discharge Instructions      You were evaluated in the Emergency Department and after careful evaluation, we did not find any emergent condition requiring admission or further testing in the hospital.  Your exam/testing today is overall reassuring.  You seem to be suffering from insomnia.  The cause is yet to be determined.  Recommend follow-up with your PCP or the neurologist for further management.  Your pain may be due to acid reflux.  Use the omeprazole medication to prevent pain, use the Carafate medication as needed for  immediate relief.  Please return to the Emergency Department if you experience any worsening of your condition.   Thank you for allowing [place] to be a part of your care.        Sabas Sous, MD 08/19/21 [phone]
